# Patient Record
Sex: Female | Born: 1995 | Hispanic: Yes | Marital: Married | Smoking: Never smoker
Health system: Southern US, Community
[De-identification: ages and names within clinical notes are randomized; demographics above are authoritative.]

## PROBLEM LIST (undated history)

## (undated) DIAGNOSIS — J45909 Unspecified asthma, uncomplicated: Secondary | ICD-10-CM

## (undated) DIAGNOSIS — D649 Anemia, unspecified: Secondary | ICD-10-CM

## (undated) HISTORY — DX: Anemia, unspecified: D64.9

## (undated) HISTORY — PX: WISDOM TOOTH EXTRACTION: SHX21

## (undated) HISTORY — DX: Unspecified asthma, uncomplicated: J45.909

---
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
## 2015-02-09 NOTE — H&P (Signed)
L&D Evaluation:  History:  HPI 19yo SHF presented with SROM of clear fluid at 0330a, [id number] EGA, G1P0000; normal PNC to date   Presents with leaking fluid   Patient's Medical History No Chronic Illness   Patient's Surgical History none   Medications Pre Natal Vitamins  Iron   Allergies NKDA   Social History none   Family History Non-Contributory   ROS:  ROS All systems were reviewed.  HEENT, CNS, GI, GU, Respiratory, CV, Renal and Musculoskeletal systems were found to be normal.   Exam:  Vital Signs stable   General no apparent distress   Mental Status clear   Chest clear   Heart normal sinus rhythm   Abdomen gravid, tender with contractions   Estimated Fetal Weight Average for gestational age   Fetal Position vtx   Edema no edema   Pelvic 3/75/-2   Mebranes Ruptured   Description clear   FHT normal rate with no decels, 128   Ucx regular   Ucx Frequency 3 min   Length of each Contraction 60 seconds   Ucx Pain Scale 8   Impression:  Impression early labor, SROM 4h ago   Plan:  Plan monitor contractions and for cervical change, fluids, ambulate   Electronic Signatures: Ines BloomerBurr, Melody N (CNM)  (Signed 18-Nov-14 07:48)  Authored: L&D Evaluation   Last Updated: 18-Nov-14 07:48 by Ulyses AmorBurr, Melody N (CNM)

## 2015-02-15 ENCOUNTER — Ambulatory Visit
Admission: RE | Admit: 2015-02-15 | Discharge: 2015-02-15 | Disposition: A | Discharge status: Home / Self Care | Payer: BLUE CROSS/BLUE SHIELD | Source: Ambulatory Visit | Attending: Family Medicine | Admitting: Family Medicine

## 2015-02-15 ENCOUNTER — Other Ambulatory Visit: Payer: Self-pay | Admitting: Family Medicine

## 2015-02-15 DIAGNOSIS — R7611 Nonspecific reaction to tuberculin skin test without active tuberculosis: Secondary | ICD-10-CM

## 2017-03-09 IMAGING — CR DG CHEST 1V
1 series · 1 of 1 positions shown · non-contrast
Comparison: 01/16/2006.

CLINICAL DATA: Asymptomatic positive PPD.

EXAM:
CHEST  1 VIEW

[dg chest 1 view]
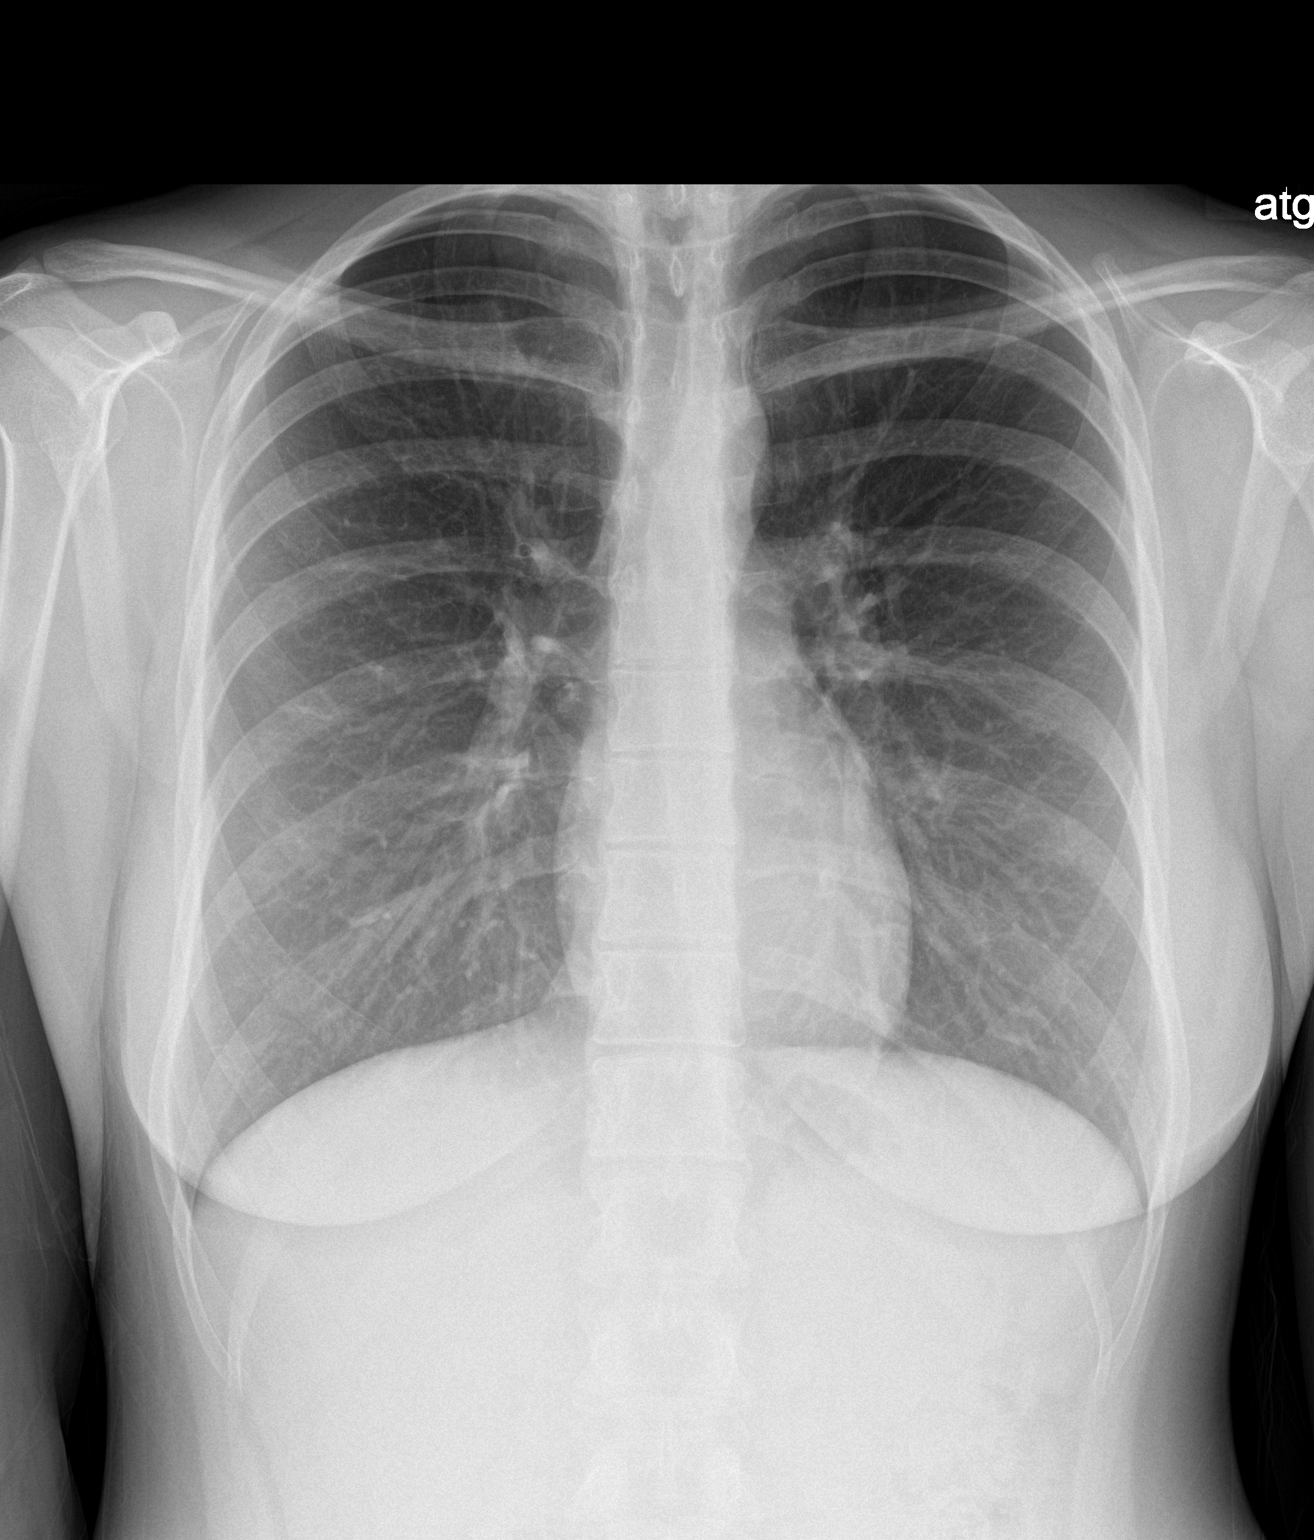

[1 of 1 positions shown; findings below may reference images not displayed]

FINDINGS: Cardiomediastinal silhouette unremarkable. Lungs clear.
Bronchovascular markings normal. Pulmonary vascularity normal. No
visible pleural effusions. No pneumothorax. Visualized bony thorax
intact.
IMPRESSION: Normal examination.

## 2018-01-30 ENCOUNTER — Ambulatory Visit (INDEPENDENT_AMBULATORY_CARE_PROVIDER_SITE_OTHER): Payer: BC Managed Care – PPO | Admitting: Obstetrics and Gynecology

## 2018-01-30 ENCOUNTER — Encounter: Payer: Self-pay | Admitting: Obstetrics and Gynecology

## 2018-01-30 VITALS — BP 91/56 | HR 72 | Ht 63.0 in | Wt 132.4 lb

## 2018-01-30 DIAGNOSIS — N361 Urethral diverticulum: Secondary | ICD-10-CM | POA: Diagnosis not present

## 2018-01-30 DIAGNOSIS — N644 Mastodynia: Secondary | ICD-10-CM | POA: Diagnosis not present

## 2018-01-30 DIAGNOSIS — Z01419 Encounter for gynecological examination (general) (routine) without abnormal findings: Secondary | ICD-10-CM | POA: Diagnosis not present

## 2018-01-30 NOTE — Patient Instructions (Signed)

## 2018-01-30 NOTE — Progress Notes (Signed)
Pt is having breast tenderness x 4 months. Last pap smear about a year ago.

## 2018-01-30 NOTE — Progress Notes (Signed)
GYNECOLOGY ANNUAL PHYSICAL EXAM PROGRESS NOTE  Subjective:    Tammy Contreras is a 22 y.o. G34P1001 female who presents for an annual exam.  The patient is sexually active.  The patient wears seatbelts: yes. The patient participates in regular exercise: no. Has the patient ever been transfused or tattooed?: no. The patient reports that there is not domestic violence in her life.   The patient has the following complaints today:  1. Patient states that she was told that she had a "lesion or bump" on her cervix [facility] Was told that it was yellow in color and small, and that it was not worrisome.  Notes that she wants to have it checked out.  Denies h/o abnormal pap smears.  Thinks she had a pap smear performed last year.  2. Breast tenderness x 4 months.  Notes that the tenderness is non-cyclic, not associated with menses.   Gynecologic History Menarche age: 69 Patient's last menstrual period was 01/20/2018. Contraception: condoms History of STI's: Denies Last Pap: patient thinks it was last year, [facility]. Results were: normal.  Denies h/o abnormal pap smears.    OB History  Gravida Para Term Preterm AB Living  0 0 1  SAB TAB Ectopic Multiple Live Births  0 0 0 0 1    # Outcome Date GA Lbr Len/2nd Weight Sex Delivery Anes PTL Lv  1 Term 2014    F Vag-Spont       Past Medical History:  Diagnosis Date  . Anemia   . Asthma     History reviewed. No pertinent surgical history.  History reviewed. No pertinent family history.  Social History   Socioeconomic History  . Marital status: Single    Spouse name: Not on file  . Number of children: Not on file  . Years of education: Not on file  . Highest education level: Not on file  Occupational History  . Not on file  Social Needs  . Financial resource strain: Not on file  . Food insecurity:    Worry: Not on file    Inability: Not on file    . Transportation needs:    Medical: Not on file    Non-medical: Not on file  Tobacco Use  . Smoking status: Never Smoker  . Smokeless tobacco: Never Used  Substance and Sexual Activity  . Alcohol use: Yes    Comment: occas  . Drug use: Never  . Sexual activity: Yes    Birth control/protection: None  Lifestyle  . Physical activity:    Days per week: Not on file    Minutes per session: Not on file  . Stress: Not on file  Relationships  . Social connections:    Talks on phone: Not on file    Gets together: Not on file    Attends religious service: Not on file    Active member of club or organization: Not on file    Attends meetings of clubs or organizations: Not on file    Relationship status: Not on file  . Intimate partner violence:    Fear of current or ex partner: Not on file    Emotionally abused: Not on file    Physically abused: Not on file    Forced sexual activity: Not on file  Other Topics Concern  . Not on file  Social History Narrative  . Not on file  No current outpatient medications on file prior to visit.   No current facility-administered medications on file prior to visit.     No Known Allergies    Review of Systems Constitutional: negative for chills, fatigue, fevers and sweats Eyes: negative for irritation, redness and visual disturbance Ears, nose, mouth, throat, and face: negative for hearing loss, nasal congestion, snoring and tinnitus Respiratory: negative for asthma, cough, sputum Cardiovascular: negative for chest pain, dyspnea, exertional chest pressure/discomfort, irregular heart beat, palpitations and syncope Gastrointestinal: negative for abdominal pain, change in bowel habits, nausea and vomiting Genitourinary: negative for abnormal menstrual periods, genital lesions, sexual problems and vaginal discharge, dysuria and urinary incontinence Integument/breast: negative for breast lump, breast tenderness and nipple  discharge Hematologic/lymphatic: negative for bleeding and easy bruising Musculoskeletal:negative for back pain and muscle weakness Neurological: negative for dizziness, headaches, vertigo and weakness Endocrine: negative for diabetic symptoms including polydipsia, polyuria and skin dryness Allergic/Immunologic: negative for hay fever and urticaria        Objective:  Blood pressure (!) 91/56, pulse 72, height  (1.6 m), weight 132 lb 6.4 oz (60.1 kg), last menstrual period 01/20/2018. Body mass index is 23.45 kg/m.   General Appearance:    Alert, cooperative, no distress, appears stated age  Head:    Normocephalic, without obvious abnormality, atraumatic  Eyes:    PERRL, conjunctiva/corneas clear, EOM's intact, both eyes  Ears:    Normal external ear canals, both ears  Nose:   Nares normal, septum midline, mucosa normal, no drainage or sinus tenderness  Throat:   Lips, mucosa, and tongue normal; teeth and gums normal  Neck:   Supple, symmetrical, trachea midline, no adenopathy; thyroid: no enlargement/tenderness/nodules; no carotid bruit or JVD  Back:     Symmetric, no curvature, ROM normal, no CVA tenderness  Lungs:     Clear to auscultation bilaterally, respirations unlabored  Chest Wall:    No tenderness or deformity   Heart:    Regular rate and rhythm, S1 and S2 normal, no murmur, rub or gallop  Breast Exam:    No tenderness, masses, or nipple abnormality  Abdomen:     Soft, non-tender, bowel sounds active all four quadrants, no masses, no organomegaly.    Genitalia:    Pelvic:external genitalia with moderate sized urethral diverticulum, soft, non-tender, no discharge expressed on palpation. Vagina without lesions, discharge, or tenderness, rectovaginal septum  normal. Cervix normal in appearance, no cervical motion tenderness, no adnexal masses or tenderness.  Uterus normal size, shape, mobile, regular contours, nontender.  Rectal:    Normal external sphincter.  No hemorrhoids  appreciated. Internal exam not done.   Extremities:   Extremities normal, atraumatic, no cyanosis or edema  Pulses:   2+ and symmetric all extremities  Skin:   Skin color, texture, turgor normal, no rashes or lesions  Lymph nodes:   Cervical, supraclavicular, and axillary nodes normal  Neurologic:   CNII-XII intact, normal strength, sensation and reflexes throughout   .  Labs:  Lab Results  Component Value Date   WBC 10.9 08/19/2013   HGB 12.1 08/19/2013   HCT 27.3 (L) 08/20/2013   MCV 97 08/19/2013   PLT 153 08/19/2013    No results found for: CREATININE, BUN, NA, K, CL, CO2  No results found for: ALT, AST, GGT, ALKPHOS, BILITOT  No results found for: TSH   Assessment:    Healthy female exam.   Urerthral diverticulum  Mastalgia   Plan:    Blood tests: CBC with diff  and Comprehensive metabolic panel. Breast self exam technique reviewed and patient encouraged to perform self-exam monthly. Contraception: condoms. Discussed healthy lifestyle modifications. Pap smear, patient thinks she had at [facility], will get records.  If no pap smear performed, will send specimen collected today, otherwise if pap smear on file, will discard current specimen.  Urethral diverticulum noted on today's exam, non-tender, non-painful. Discussed treatment options, including drainage, or referral to Urology for excision.  Patient notes that she had a history of these during her pregnancy several years ago that were drained, but has not been symptomatic since then. Desires expectant management currently as she is not symptomatic.  Mastalgia - discussed conservative treatment measures including ice packs, supportive bras, and decreasing caffeine intake.   Hildred Laser, MD [facility]

## 2018-01-31 LAB — COMPREHENSIVE METABOLIC PANEL WITH GFR
ALT: 11 IU/L (ref 0–32)
AST: 12 IU/L (ref 0–40)
Albumin/Globulin Ratio: 1.5 (ref 1.2–2.2)
Albumin: 4.6 g/dL (ref 3.5–5.5)
Alkaline Phosphatase: 68 IU/L (ref 39–117)
BUN/Creatinine Ratio: 16 (ref 9–23)
BUN: 10 mg/dL (ref 6–20)
Bilirubin Total: 0.5 mg/dL (ref 0.0–1.2)
CO2: 25 mmol/L (ref 20–29)
Calcium: 9.8 mg/dL (ref 8.7–10.2)
Chloride: 104 mmol/L (ref 96–106)
Creatinine, Ser: 0.61 mg/dL (ref 0.57–1.00)
GFR calc Af Amer: 149 mL/min/1.73
GFR calc non Af Amer: 129 mL/min/1.73
Globulin, Total: 3 g/dL (ref 1.5–4.5)
Glucose: 74 mg/dL (ref 65–99)
Potassium: 4.2 mmol/L (ref 3.5–5.2)
Sodium: 142 mmol/L (ref 134–144)
Total Protein: 7.6 g/dL (ref 6.0–8.5)

## 2018-01-31 LAB — CBC
Hematocrit: 35.9 % (ref 34.0–46.6)
Hemoglobin: 12.1 g/dL (ref 11.1–15.9)
MCH: 30.1 pg (ref 26.6–33.0)
MCHC: 33.7 g/dL (ref 31.5–35.7)
MCV: 89 fL (ref 79–97)
Platelets: 233 x10E3/uL (ref 150–379)
RBC: 4.02 x10E6/uL (ref 3.77–5.28)
RDW: 13.4 % (ref 12.3–15.4)
WBC: 5.7 x10E3/uL (ref 3.4–10.8)

## 2018-06-05 ENCOUNTER — Encounter: Payer: Self-pay | Admitting: Obstetrics and Gynecology

## 2018-06-05 ENCOUNTER — Ambulatory Visit: Payer: BC Managed Care – PPO | Admitting: Obstetrics and Gynecology

## 2018-06-05 ENCOUNTER — Other Ambulatory Visit (HOSPITAL_COMMUNITY)
Admission: RE | Admit: 2018-06-05 | Discharge: 2018-06-05 | Disposition: A | Discharge status: Home / Self Care | Payer: BC Managed Care – PPO | Source: Ambulatory Visit | Attending: Obstetrics and Gynecology | Admitting: Obstetrics and Gynecology

## 2018-06-05 VITALS — BP 96/64 | HR 73 | Ht 63.0 in | Wt 133.5 lb

## 2018-06-05 DIAGNOSIS — N361 Urethral diverticulum: Secondary | ICD-10-CM

## 2018-06-05 DIAGNOSIS — R399 Unspecified symptoms and signs involving the genitourinary system: Secondary | ICD-10-CM | POA: Diagnosis not present

## 2018-06-05 DIAGNOSIS — B373 Candidiasis of vulva and vagina: Secondary | ICD-10-CM | POA: Diagnosis not present

## 2018-06-05 DIAGNOSIS — N898 Other specified noninflammatory disorders of vagina: Secondary | ICD-10-CM

## 2018-06-05 DIAGNOSIS — R1030 Lower abdominal pain, unspecified: Secondary | ICD-10-CM | POA: Diagnosis not present

## 2018-06-05 LAB — POCT URINALYSIS DIPSTICK
Bilirubin, UA: NEGATIVE
GLUCOSE UA: NEGATIVE
Ketones, UA: NEGATIVE
Nitrite, UA: NEGATIVE
Protein, UA: NEGATIVE
Spec Grav, UA: 1.01 (ref 1.010–1.025)
Urobilinogen, UA: 0.2 E.U./dL
pH, UA: 8 (ref 5.0–8.0)

## 2018-06-05 MED ORDER — SULFAMETHOXAZOLE-TRIMETHOPRIM 800-160 MG PO TABS: 1.0000 | ORAL_TABLET | Freq: Two times a day (BID) | ORAL | 1 refills | Status: DC

## 2018-06-05 NOTE — Progress Notes (Signed)
Pt stated that she had bad stomach and vaginal pain starting Monday and some pain yesterday.  Pt stated that her urine smelled weird.

## 2018-06-05 NOTE — Progress Notes (Signed)
    GYNECOLOGY PROGRESS NOTE  Subjective:    Patient ID: Tammy Contreras, female    DOB: 1996/02/25, 22 y.o.   MRN: [id number]  HPI  Patient is a 22 y.o. G30P1001 female with h/o urethral diverticulum who presents for complaints of abdominal pain and vaginal pain and in lower back on Monday.  Pain lasted about 1 hour, was sharp, stabbing. Had difficulty moving.  Denies nausea/vomiting. Drank chamomile tea which helped her to go to sleep. Noted pain again yesterday, but not as severe. Also noting a urinary odor for the past several days. She does report vaginal spotting on Saturday during sexual intercourse. Denies dysuria, hematuria. Is not on any contraception or barrier method.   The following portions of the patient's history were reviewed and updated as appropriate: allergies, current medications, past family history, past medical history, past social history, past surgical history and problem list.  Review of Systems Pertinent items noted in HPI and remainder of comprehensive ROS otherwise negative.   Objective:   Blood pressure 96/64, pulse 73, height 5\' 3"  (1.6 m), weight 133 lb 8 oz (60.6 kg), last menstrual period 05/10/2018. General appearance: alert and no distress Abdomen: normal findings: bowel sounds normal, no bruits heard and soft and abnormal findings:  mild tenderness in the lower abdomen (mostly near midline, suprapubic region)  Pelvic: external genitalia normal, rectovaginal septum normal. Large urethral diverticulum present, mildly tender to palpation, expression of yellow thick fluid on palpation.   Vagina with small amount of thin white discharge, no odor.  Cervix normal appearing, no lesions and no motion tenderness.  Uterus mobile, nontender, normal shape and size.  Adnexae non-palpable, nontender bilaterally.  Back: symmetric, no curvature. ROM normal. No CVA tenderness. Neurologic: grossly intact  Labs: Results for orders placed or performed in visit on  06/05/18  POCT urinalysis dipstick  Result Value Ref Range   Color, UA yellow    Clarity, UA clear    Glucose, UA Negative Negative   Bilirubin, UA neg    Ketones, UA neg    Spec Grav, UA 1.010 1.010 - 1.025   Blood, UA non hem trace    pH, UA 8.0 5.0 - 8.0   Protein, UA Negative Negative   Urobilinogen, UA 0.2 0.2 or 1.0 E.U./dL   Nitrite, UA neg    Leukocytes, UA Moderate (2+) (A) Negative   Appearance yellow    Odor       Assessment:   Abdominal pain UTI symptoms Urethral diverticulum Vaginal discharge  Plan:   - Unclear cause of abdominal (and vaginal pain), however symptoms similar to UTI but no evidence of UTI obvious on today's UA.  Also will need to r/o vaginitis/cervicitis as cause of pain.  Patient did have non-hemolyzed trace RBCs on UA.  Will send urine and urethral culture also due to large diverticulum present with thick yellow discharge.  Vaginal swab performed to r/o vaginitis/cervicitis.  - Initiated on Bactrim to treat possible urethritis, UTI. Encouraged adequate hydration.  - Offered to drain diverticulum, but patient declined today. Advised to do sitz baths and can manually express fluid from diverticulum as well.  - Counseled on safe sex practices. - To use OTC pain relief meds as needed.  - Further workup and treatment could be done if symptoms persist, worsen or new related symptoms occur. The patient will call in that eventuality.    Hildred Laser, MD [facility]

## 2018-06-06 LAB — CERVICOVAGINAL ANCILLARY ONLY
BACTERIAL VAGINITIS: NEGATIVE
CANDIDA VAGINITIS: POSITIVE — AB
Chlamydia: NEGATIVE
Neisseria Gonorrhea: NEGATIVE
Trichomonas: NEGATIVE

## 2018-06-07 ENCOUNTER — Other Ambulatory Visit: Payer: Self-pay

## 2018-06-07 ENCOUNTER — Telehealth: Payer: Self-pay | Admitting: Obstetrics and Gynecology

## 2018-06-07 MED ORDER — FLUCONAZOLE 150 MG PO TABS: 150.0000 mg | ORAL_TABLET | Freq: Once | ORAL | 1 refills | Status: AC

## 2018-06-07 NOTE — Telephone Encounter (Signed)
The patient called and stated that she missed a call, I was unable to see any documentation in the patients chart of a missed call. Please advise.

## 2018-06-07 NOTE — Telephone Encounter (Signed)
Pt was called and went over test results with pt. Pt is aware of test results.

## 2018-06-09 LAB — ANAEROBIC AND AEROBIC CULTURE

## 2018-06-19 ENCOUNTER — Telehealth: Payer: Self-pay | Admitting: Obstetrics and Gynecology

## 2018-06-19 ENCOUNTER — Other Ambulatory Visit: Payer: Self-pay

## 2018-06-19 MED ORDER — FLUCONAZOLE 150 MG PO TABS: 150.0000 mg | ORAL_TABLET | Freq: Once | ORAL | 0 refills | Status: AC

## 2018-06-19 MED ORDER — NITROFURANTOIN MONOHYD MACRO 100 MG PO CAPS: 100.0000 mg | ORAL_CAPSULE | Freq: Two times a day (BID) | ORAL | 0 refills | Status: DC

## 2018-06-19 NOTE — Telephone Encounter (Signed)
The patient is calling the nurse back and has questions.  She is requesting a call from the nurse again, please advise, thanks.

## 2018-06-20 NOTE — Telephone Encounter (Signed)
Spoke with pt yesterday. Pt stated that she had a positive pregnancy test and wanted to know if the test was positive due to her having yeast infection and a UTI. Pt was informed that having those dx would not cause her pregnancy test to be positive. Pt stated that [facility] to [facility]. Pt made an appt and will be seen in the office.

## 2018-06-24 ENCOUNTER — Telehealth: Payer: Self-pay | Admitting: Obstetrics and Gynecology

## 2018-06-24 NOTE — Telephone Encounter (Signed)
The patient is having pain in the belt-line area of her abdomen right below her belly button, and it radiates to her back in the same spot, "towards my butt. I can kinda feel it back there too."  The contact number has voicemail set up.  The pain comes and goes anytime.  Pain is not relieved when she lies down.  The patient is asking for the nurse or provider to contact her on what she needs to do.  Please advise, thanks.

## 2018-06-25 ENCOUNTER — Other Ambulatory Visit: Payer: Self-pay | Admitting: Obstetrics and Gynecology

## 2018-06-25 ENCOUNTER — Encounter: Payer: Self-pay | Admitting: Obstetrics and Gynecology

## 2018-06-25 ENCOUNTER — Other Ambulatory Visit (INDEPENDENT_AMBULATORY_CARE_PROVIDER_SITE_OTHER): Payer: BC Managed Care – PPO

## 2018-06-25 ENCOUNTER — Ambulatory Visit (INDEPENDENT_AMBULATORY_CARE_PROVIDER_SITE_OTHER): Payer: BC Managed Care – PPO | Admitting: Obstetrics and Gynecology

## 2018-06-25 VITALS — BP 92/58 | HR 74 | Ht 63.0 in | Wt 132.9 lb

## 2018-06-25 DIAGNOSIS — Z3201 Encounter for pregnancy test, result positive: Secondary | ICD-10-CM

## 2018-06-25 DIAGNOSIS — N361 Urethral diverticulum: Secondary | ICD-10-CM

## 2018-06-25 DIAGNOSIS — Z3A01 Less than 8 weeks gestation of pregnancy: Secondary | ICD-10-CM | POA: Diagnosis not present

## 2018-06-25 DIAGNOSIS — N926 Irregular menstruation, unspecified: Secondary | ICD-10-CM

## 2018-06-25 DIAGNOSIS — O200 Threatened abortion: Secondary | ICD-10-CM

## 2018-06-25 LAB — POCT URINE PREGNANCY: Preg Test, Ur: POSITIVE — AB

## 2018-06-25 NOTE — Telephone Encounter (Signed)
Pt called this morning and [facility]

## 2018-06-25 NOTE — Progress Notes (Signed)
GYNECOLOGY CLINIC PROGRESS NOTE Subjective:    Tammy Contreras is a 22 y.o. 191P1001 female. [place] reports bleeding and pelvic pain since early today.  Bleeding is brown, light flow (mostly spotting).  Plevic pain feels like cramping, radiates to the back, currently 7-8/10. Patient reports positive home pregnancy test ~ 2 weeks ago. She is not in acute distress. Ectopic risks: none.   Cycle length: regular, every 28-30 days. Pregnancy testing: at home.   The following portions of the patient's history were reviewed and updated as appropriate: allergies, current medications, past family history, past medical history, past social history, past surgical history and problem list.  Review of Systems Pertinent items noted in HPI and remainder of comprehensive ROS otherwise negative.   Objective:     BP (!) 92/58   Pulse 74   Ht 5\' 3"  (1.6 m)   Wt 132 lb 14.4 oz (60.3 kg)   LMP 05/10/2018   BMI 23.54 kg/m  General:   alert and no distress  Heart: regular rate and rhythm, S1, S2 normal, no murmur, click, rub or gallop  Lungs: clear to auscultation bilaterally  Abdomen: soft, active bowel sounds, tenderness mild in the lower abdomen, without guarding and without rebound  Pelvic: External genitalia: normal general appearance Urinary system: urethral diverticulum present, ~ 2 x 3 cm, with yellow discharge draining when palpated Vaginal: normal mucosa without prolapse or lesions. Scant light brown discharge in vaginal vault.  Cervix: normal appearance, closed.  Adnexa: normal bimanual exam Uterus: normal mobile, nontender   Imaging Office ultrasound:    [facility] REPORT  Location: [facility] Date of Service:  06/25/2018  Indications: Dating/Viability; Spotting and pain Findings:  A single intrauterine gestational sac is visualized measuring approximately 5 4/[redacted] weeks gestation, giving an (U/S) EDD of 02/21/19. A clinical EDD has not been established, however, today's  scan does not agree with patient stated LMP of 05/10/18.  A normal appearing yolk sac is identified within the gestational sac. A fetal pole is not identified at this time (early pregnancy vs other etiology).  Right Ovary measures 3.5 x 3.0 x 2.3 cm. It is normal in appearance. Left Ovary measures 3.1 x 2.2 x 2.0 cm. It is normal appearance. There is evidence of a corpus luteal cyst in the right ovary. Survey of the adnexa demonstrates no adnexal masses. There is no free peritoneal fluid in the cul de sac.  Impression: 1. Single intrauterine gestational sac measuring approximately 5 4/7 weeks. 2. A fetal pole is not identified at this time. 3. A clinical EDD has not been established, however, today's scan does not agree with patient stated LMP of 05/10/18. 4. Notified Dr. Valentino Saxonherry of findings.  Recommendations: 1.Clinical correlation with the patient's History and Physical Exam. 2. Repeat U/S in 2 weeks to reassess dating/viability.  Kari BaarsJill Long, RDMS   I have reviewed this study and agree with documented findings.    Hildred LaserAnika Shonta Bourque, MD [facility]   Assessment:    IUP at [redacted] weeks gestation Threatened abortion  Urethral diverticulum  Plan:   UPT positive today in clinic. Ultrasound notes IUP, gestational sac but no fetal pole noted yet, at [redacted] weeks gestation.   Blood type and Rh: ordered. Follow-up appointment with ultrasound on 2 weeks to f/u viability.   Given bleeding precautions.  Advised on Tylenol for pain and cramping prn.  Encouraged to begin PNV.  Samples given.  Urethral diverticulum recently treated with a course of antibiotics, more fluid expressed  today, continues to decline needle aspiration.   Hildred Laser, MD [facility]

## 2018-06-25 NOTE — Progress Notes (Signed)
Pt is present today due to noticing some brown blood never seen any bright red blood. Pt stated that she noticed this blood 2 times. Pt stated that she has pain in the lower abd/pelvic area that goes into the lower back. Pt stated pain level 7-8.

## 2018-06-26 MED ORDER — CITRANATAL ASSURE 35-1 & 300 MG PO MISC: 1.0000 | Freq: Every day | ORAL | 11 refills | Status: DC

## 2018-06-26 NOTE — Addendum Note (Signed)
Addended by: Silvano BilisHAMPTON, Shantae Vantol L on: 06/26/2018 10:45 AM   Modules accepted: Orders

## 2018-06-28 ENCOUNTER — Encounter: Payer: BC Managed Care – PPO | Admitting: Obstetrics and Gynecology

## 2018-07-16 ENCOUNTER — Ambulatory Visit (INDEPENDENT_AMBULATORY_CARE_PROVIDER_SITE_OTHER): Payer: BC Managed Care – PPO

## 2018-07-16 ENCOUNTER — Ambulatory Visit: Payer: BC Managed Care – PPO | Admitting: Obstetrics and Gynecology

## 2018-07-16 VITALS — BP 110/65 | HR 85 | Ht 63.0 in | Wt 133.8 lb

## 2018-07-16 DIAGNOSIS — N8311 Corpus luteum cyst of right ovary: Secondary | ICD-10-CM

## 2018-07-16 DIAGNOSIS — O3411 Maternal care for benign tumor of corpus uteri, first trimester: Secondary | ICD-10-CM | POA: Diagnosis not present

## 2018-07-16 DIAGNOSIS — O200 Threatened abortion: Secondary | ICD-10-CM

## 2018-07-16 DIAGNOSIS — Z3A08 8 weeks gestation of pregnancy: Secondary | ICD-10-CM | POA: Diagnosis not present

## 2018-07-16 DIAGNOSIS — N926 Irregular menstruation, unspecified: Secondary | ICD-10-CM

## 2018-07-16 LAB — OB RESULTS CONSOLE VARICELLA ZOSTER ANTIBODY, IGG: Varicella: IMMUNE

## 2018-07-16 NOTE — Patient Instructions (Signed)
First Trimester of Pregnancy The first trimester of pregnancy is from week 1 until the end of week 13 (months 1 through 3). During this time, your baby will begin to develop inside you. At 6-8 weeks, the eyes and face are formed, and the heartbeat can be seen on ultrasound. At the end of 12 weeks, all the baby's organs are formed. Prenatal care is all the medical care you receive before the birth of your baby. Make sure you get good prenatal care and follow all of your doctor's instructions. Follow these instructions at home: Medicines  Take over-the-counter and prescription medicines only as told by your doctor. Some medicines are safe and some medicines are not safe during pregnancy.  Take a prenatal vitamin that contains at least 600 micrograms (mcg) of folic acid.  If you have trouble pooping (constipation), take medicine that will make your stool soft (stool softener) if your doctor approves. Eating and drinking  Eat regular, healthy meals.  Your doctor will tell you the amount of weight gain that is right for you.  Avoid raw meat and uncooked cheese.  If you feel sick to your stomach (nauseous) or throw up (vomit): ? Eat 4 or 5 small meals a day instead of 3 large meals. ? Try eating a few soda crackers. ? Drink liquids between meals instead of during meals.  To prevent constipation: ? Eat foods that are high in fiber, like fresh fruits and vegetables, whole grains, and beans. ? Drink enough fluids to keep your pee (urine) clear or pale yellow. Activity  Exercise only as told by your doctor. Stop exercising if you have cramps or pain in your lower belly (abdomen) or low back.  Do not exercise if it is too hot, too humid, or if you are in a place of great height (high altitude).  Try to avoid standing for long periods of time. Move your legs often if you must stand in one place for a long time.  Avoid heavy lifting.  Wear low-heeled shoes. Sit and stand up straight.  You  can have sex unless your doctor tells you not to. Relieving pain and discomfort  Wear a good support bra if your breasts are sore.  Take warm water baths (sitz baths) to soothe pain or discomfort caused by hemorrhoids. Use hemorrhoid cream if your doctor says it is okay.  Rest with your legs raised if you have leg cramps or low back pain.  If you have puffy, bulging veins (varicose veins) in your legs: ? Wear support hose or compression stockings as told by your doctor. ? Raise (elevate) your feet for 15 minutes, 3-4 times a day. ? Limit salt in your food. Prenatal care  Schedule your prenatal visits by the twelfth week of pregnancy.  Write down your questions. Take them to your prenatal visits.  Keep all your prenatal visits as told by your doctor. This is important. Safety  Wear your seat belt at all times when driving.  Make a list of emergency phone numbers. The list should include numbers for family, friends, the hospital, and police and fire departments. General instructions  Ask your doctor for a referral to a local prenatal class. Begin classes no later than at the start of month 6 of your pregnancy.  Ask for help if you need counseling or if you need help with nutrition. Your doctor can give you advice or tell you where to go for help.  Do not use hot tubs, steam rooms, or   saunas.  Do not douche or use tampons or scented sanitary pads.  Do not cross your legs for long periods of time.  Avoid all herbs and alcohol. Avoid drugs that are not approved by your doctor.  Do not use any tobacco products, including cigarettes, chewing tobacco, and electronic cigarettes. If you need help quitting, ask your doctor. You may get counseling or other support to help you quit.  Avoid cat litter boxes and soil used by cats. These carry germs that can cause birth defects in the baby and can cause a loss of your baby (miscarriage) or stillbirth.  Visit your dentist. At home, brush  your teeth with a soft toothbrush. Be gentle when you floss. Contact a doctor if:  You are dizzy.  You have mild cramps or pressure in your lower belly.  You have a nagging pain in your belly area.  You continue to feel sick to your stomach, you throw up, or you have watery poop (diarrhea).  You have a bad smelling fluid coming from your vagina.  You have pain when you pee (urinate).  You have increased puffiness (swelling) in your face, hands, legs, or ankles. Get help right away if:  You have a fever.  You are leaking fluid from your vagina.  You have spotting or bleeding from your vagina.  You have very bad belly cramping or pain.  You gain or lose weight rapidly.  You throw up blood. It may look like coffee grounds.  You are around people who have German measles, fifth disease, or chickenpox.  You have a very bad headache.  You have shortness of breath.  You have any kind of trauma, such as from a fall or a car accident. Summary  The first trimester of pregnancy is from week 1 until the end of week 13 (months 1 through 3).  To take care of yourself and your unborn baby, you will need to eat healthy meals, take medicines only if your doctor tells you to do so, and do activities that are safe for you and your baby.  Keep all follow-up visits as told by your doctor. This is important as your doctor will have to ensure that your baby is healthy and growing well. This information is not intended to replace advice given to you by your health care provider. Make sure you discuss any questions you have with your health care provider. Document Released: 03/06/2008 Document Revised: 09/26/2016 Document Reviewed: 09/26/2016 Elsevier Interactive Patient Education  2017 [employer]  

## 2018-07-16 NOTE — Progress Notes (Signed)
Tammy Contreras presents for NOB nurse interview visit. Pregnancy confirmation done here at [facility]..  G-2 .  P-  1  . Pregnancy education material explained and given. _No cats in the home. NOB labs ordered, . HIV labs and Drug screen were explained optional and she did not decline. Drug screen ordered PNV encouraged. Genetic screening options discussed. Genetic testing:/Unsure.  Pt may discuss with provider. Pt. To follow up with provider in _3_ weeks for NOB physical.  All questions answered.FMLA paperwork signed, financial policy discussed with pt.  She voiced understanding

## 2018-07-17 LAB — HIV ANTIBODY (ROUTINE TESTING W REFLEX): HIV Screen 4th Generation wRfx: NONREACTIVE

## 2018-07-17 LAB — URINALYSIS, ROUTINE W REFLEX MICROSCOPIC
BILIRUBIN UA: NEGATIVE
Glucose, UA: NEGATIVE
Ketones, UA: NEGATIVE
Nitrite, UA: NEGATIVE
PH UA: 7.5 (ref 5.0–7.5)
Protein, UA: NEGATIVE
Specific Gravity, UA: 1.02 (ref 1.005–1.030)
UUROB: 0.2 mg/dL (ref 0.2–1.0)

## 2018-07-17 LAB — HGB SOLU + RFLX FRAC: Sickle Solubility Test - HGBRFX: NEGATIVE

## 2018-07-17 LAB — MICROSCOPIC EXAMINATION
CASTS: NONE SEEN /LPF
Epithelial Cells (non renal): >10 /hpf — AB (ref 0–10)

## 2018-07-17 LAB — RPR: RPR Ser Ql: NONREACTIVE

## 2018-07-17 LAB — RUBELLA SCREEN: Rubella Antibodies, IGG: 1.61 index (ref >0.99)

## 2018-07-17 LAB — ANTIBODY SCREEN: ANTIBODY SCREEN: NEGATIVE

## 2018-07-17 LAB — ABO AND RH: RH TYPE: POSITIVE

## 2018-07-17 LAB — VARICELLA ZOSTER ANTIBODY, IGG: Varicella zoster IgG: 914 index (ref >165)

## 2018-07-17 LAB — HEPATITIS B SURFACE ANTIGEN: Hepatitis B Surface Ag: NEGATIVE

## 2018-07-18 LAB — MONITOR DRUG PROFILE 14(MW)
Amphetamine Scrn, Ur: NEGATIVE ng/mL
BARBITURATE SCREEN URINE: NEGATIVE ng/mL
BENZODIAZEPINE SCREEN, URINE: NEGATIVE ng/mL
BUPRENORPHINE, URINE: NEGATIVE ng/mL
CANNABINOIDS UR QL SCN: NEGATIVE ng/mL
CREATININE(CRT), U: 135.6 mg/dL (ref 20.0–300.0)
Cocaine (Metab) Scrn, Ur: NEGATIVE ng/mL
FENTANYL, URINE: NEGATIVE pg/mL
MEPERIDINE SCREEN, URINE: NEGATIVE ng/mL
Methadone Screen, Urine: NEGATIVE ng/mL
OXYCODONE+OXYMORPHONE UR QL SCN: NEGATIVE ng/mL
Opiate Scrn, Ur: NEGATIVE ng/mL
PHENCYCLIDINE QUANTITATIVE URINE: NEGATIVE ng/mL
Ph of Urine: 7.7 (ref 4.5–8.9)
Propoxyphene Scrn, Ur: NEGATIVE ng/mL
SPECIFIC GRAVITY: 1.014
TRAMADOL SCREEN, URINE: NEGATIVE ng/mL

## 2018-07-18 LAB — URINE CULTURE

## 2018-07-18 LAB — GC/CHLAMYDIA PROBE AMP
CHLAMYDIA, DNA PROBE: NEGATIVE
Neisseria gonorrhoeae by PCR: NEGATIVE

## 2018-08-07 ENCOUNTER — Telehealth: Payer: Self-pay | Admitting: Obstetrics and Gynecology

## 2018-08-07 NOTE — Telephone Encounter (Signed)
The patient called and stated that she would like to speak with a nurse in regards to getting clarification on her medications that were sent to her pharmacy. No other information was disclosed. Please advise.

## 2018-08-08 NOTE — Telephone Encounter (Signed)
We discussed her prenatal vitamins

## 2018-08-16 ENCOUNTER — Encounter: Payer: Self-pay | Admitting: Obstetrics and Gynecology

## 2018-08-16 ENCOUNTER — Other Ambulatory Visit (HOSPITAL_COMMUNITY)
Admission: RE | Admit: 2018-08-16 | Discharge: 2018-08-16 | Disposition: A | Discharge status: Home / Self Care | Payer: BC Managed Care – PPO | Source: Ambulatory Visit | Attending: Obstetrics and Gynecology | Admitting: Obstetrics and Gynecology

## 2018-08-16 ENCOUNTER — Ambulatory Visit (INDEPENDENT_AMBULATORY_CARE_PROVIDER_SITE_OTHER): Payer: BC Managed Care – PPO | Admitting: Obstetrics and Gynecology

## 2018-08-16 VITALS — BP 109/67 | HR 76 | Wt 132.0 lb

## 2018-08-16 DIAGNOSIS — Z3492 Encounter for supervision of normal pregnancy, unspecified, second trimester: Secondary | ICD-10-CM | POA: Diagnosis not present

## 2018-08-16 DIAGNOSIS — O9989 Other specified diseases and conditions complicating pregnancy, childbirth and the puerperium: Secondary | ICD-10-CM

## 2018-08-16 DIAGNOSIS — Z3A13 13 weeks gestation of pregnancy: Secondary | ICD-10-CM

## 2018-08-16 DIAGNOSIS — N361 Urethral diverticulum: Secondary | ICD-10-CM

## 2018-08-16 LAB — POCT URINALYSIS DIPSTICK OB
Bilirubin, UA: NEGATIVE
Glucose, UA: NEGATIVE
Ketones, UA: 40
NITRITE UA: NEGATIVE
PH UA: 7 (ref 5.0–8.0)
SPEC GRAV UA: 1.02 (ref 1.010–1.025)
UROBILINOGEN UA: 0.2 U/dL

## 2018-08-16 NOTE — Patient Instructions (Signed)
Second Trimester of Pregnancy The second trimester is from week 13 through week 28, month 4 through 6. This is often the time in pregnancy that you feel your best. Often times, morning sickness has lessened or quit. You may have more energy, and you may get hungry more often. Your unborn baby (fetus) is growing rapidly. At the end of the sixth month, he or she is about 9 inches long and weighs about 1 pounds. You will likely feel the baby move (quickening) between 18 and 20 weeks of pregnancy. Follow these instructions at home:  Avoid all smoking, herbs, and alcohol. Avoid drugs not approved by your doctor.  Do not use any tobacco products, including cigarettes, chewing tobacco, and electronic cigarettes. If you need help quitting, ask your doctor. You may get counseling or other support to help you quit.  Only take medicine as told by your doctor. Some medicines are safe and some are not during pregnancy.  Exercise only as told by your doctor. Stop exercising if you start having cramps.  Eat regular, healthy meals.  Wear a good support bra if your breasts are tender.  Do not use hot tubs, steam rooms, or saunas.  Wear your seat belt when driving.  Avoid raw meat, uncooked cheese, and liter boxes and soil used by cats.  Take your prenatal vitamins.  Take 1500-2000 milligrams of calcium daily starting at the 20th week of pregnancy until you deliver your baby.  Try taking medicine that helps you poop (stool softener) as needed, and if your doctor approves. Eat more fiber by eating fresh fruit, vegetables, and whole grains. Drink enough fluids to keep your pee (urine) clear or pale yellow.  Take warm water baths (sitz baths) to soothe pain or discomfort caused by hemorrhoids. Use hemorrhoid cream if your doctor approves.  If you have puffy, bulging veins (varicose veins), wear support hose. Raise (elevate) your feet for 15 minutes, 3-4 times a day. Limit salt in your diet.  Avoid heavy  lifting, wear low heals, and sit up straight.  Rest with your legs raised if you have leg cramps or low back pain.  Visit your dentist if you have not gone during your pregnancy. Use a soft toothbrush to brush your teeth. Be gentle when you floss.  You can have sex (intercourse) unless your doctor tells you not to.  Go to your doctor visits. Get help if:  You feel dizzy.  You have mild cramps or pressure in your lower belly (abdomen).  You have a nagging pain in your belly area.  You continue to feel sick to your stomach (nauseous), throw up (vomit), or have watery poop (diarrhea).  You have bad smelling fluid coming from your vagina.  You have pain with peeing (urination). Get help right away if:  You have a fever.  You are leaking fluid from your vagina.  You have spotting or bleeding from your vagina.  You have severe belly cramping or pain.  You lose or gain weight rapidly.  You have trouble catching your breath and have chest pain.  You notice sudden or extreme puffiness (swelling) of your face, hands, ankles, feet, or legs.  You have not felt the baby move in over an hour.  You have severe headaches that do not go away with medicine.  You have vision changes. This information is not intended to replace advice given to you by your health care provider. Make sure you discuss any questions you have with your health care   provider. Document Released: 12/13/2009 Document Revised: 02/24/2016 Document Reviewed: 11/19/2012 Elsevier Interactive Patient Education  2017 Elsevier Inc.  

## 2018-08-16 NOTE — Progress Notes (Signed)
NEW OB HISTORY AND PHYSICAL  SUBJECTIVE:       Tammy Contreras is a 22 y.o. G16P1001 female, Patient's last menstrual period was 05/16/2018., Estimated Date of Delivery: 02/20/19, [redacted]w[redacted]d, presents today for establishment of Prenatal Care. She has no unusual complaints except pressure and pain at urethral cyst      Gynecologic History Patient's last menstrual period was 05/16/2018. Unknown Contraception: none Last Pap: 2017. Results were: normal  Obstetric History OB History  Gravida Para Term Preterm AB Living  2 1 1     1   SAB TAB Ectopic Multiple Live Births        1 1    # Outcome Date GA Lbr Len/2nd Weight Sex Delivery Anes PTL Lv  2A Gravida           2B Current           1 Term 2014    F Vag-Spont       Past Medical History:  Diagnosis Date  . Anemia   . Asthma     History reviewed. No pertinent surgical history.  Current Outpatient Medications on File Prior to Visit  Medication Sig Dispense Refill  . Prenat w/o A-FeCbGl-DSS-FA-DHA (CITRANATAL ASSURE) 35-1 & 300 MG tablet Take 1 tablet by mouth daily. 30 tablet 11  . nitrofurantoin, macrocrystal-monohydrate, (MACROBID) 100 MG capsule Take 1 capsule (100 mg total) by mouth 2 (two) times daily. (Patient not taking: Reported on 07/16/2018) 14 capsule 0  . sulfamethoxazole-trimethoprim (BACTRIM DS,SEPTRA DS) 800-160 MG tablet Take 1 tablet by mouth 2 (two) times daily. (Patient not taking: Reported on 07/16/2018) 14 tablet 1   No current facility-administered medications on file prior to visit.     No Known Allergies  Social History   Socioeconomic History  . Marital status: Single    Spouse name: Not on file  . Number of children: Not on file  . Years of education: Not on file  . Highest education level: Not on file  Occupational History  . Not on file  Social Needs  . Financial resource strain: Not on file  . Food insecurity:    Worry: Not on file    Inability: Not on file  . Transportation needs:     Medical: Not on file    Non-medical: Not on file  Tobacco Use  . Smoking status: Never Smoker  . Smokeless tobacco: Never Used  Substance and Sexual Activity  . Alcohol use: Yes    Comment: occas  . Drug use: Never  . Sexual activity: Yes    Birth control/protection: None  Lifestyle  . Physical activity:    Days per week: Not on file    Minutes per session: Not on file  . Stress: Not on file  Relationships  . Social connections:    Talks on phone: Not on file    Gets together: Not on file    Attends religious service: Not on file    Active member of club or organization: Not on file    Attends meetings of clubs or organizations: Not on file    Relationship status: Not on file  . Intimate partner violence:    Fear of current or ex partner: Not on file    Emotionally abused: Not on file    Physically abused: Not on file    Forced sexual activity: Not on file  Other Topics Concern  . Not on file  Social History Narrative  . Not on file  Family History  Problem Relation Age of Onset  . Healthy Mother   . Healthy Father     The following portions of the patient's history were reviewed and updated as appropriate: allergies, current medications, past OB history, past medical history, past surgical history, past family history, past social history, and problem list.    OBJECTIVE: Initial Physical Exam (New OB)  GENERAL APPEARANCE: alert, well appearing, in no apparent distress, oriented to person, place and time HEAD: normocephalic, atraumatic MOUTH: mucous membranes moist, pharynx normal without lesions and dental hygiene good THYROID: no thyromegaly or masses present BREASTS: not examined LUNGS: clear to auscultation, no wheezes, rales or rhonchi, symmetric air entry HEART: regular rate and rhythm, no murmurs ABDOMEN: soft, nontender, nondistended, no abnormal masses, no epigastric pain, fundus not palpable and FHT present EXTREMITIES: no redness or tenderness  in the calves or thighs SKIN: normal coloration and turgor, no rashes LYMPH NODES: no adenopathy palpable NEUROLOGIC: alert, oriented, normal speech, no focal findings or movement disorder noted  PELVIC EXAM EXTERNAL GENITALIA: normal appearing vulva with no masses, tenderness or lesions, 3cm urethral cyst- desires I&D as it is uncomfortable   ASSESSMENT: Normal pregnancy Urethral cyst  PLAN: Prenatal care Genetic screening done See orders

## 2018-08-16 NOTE — Progress Notes (Signed)
NOB PE- pt is doing well, is having a much better week, not near as nauseous

## 2018-08-17 LAB — CBC
Hematocrit: 32.8 % — ABNORMAL LOW (ref 34.0–46.6)
Hemoglobin: 11.2 g/dL (ref 11.1–15.9)
MCH: 30.9 pg (ref 26.6–33.0)
MCHC: 34.1 g/dL (ref 31.5–35.7)
MCV: 90 fL (ref 79–97)
PLATELETS: 198 10*3/uL (ref 150–450)
RBC: 3.63 x10E6/uL — ABNORMAL LOW (ref 3.77–5.28)
RDW: 12.8 % (ref 12.3–15.4)
WBC: 6.4 10*3/uL (ref 3.4–10.8)

## 2018-08-17 LAB — FERRITIN: FERRITIN: 27 ng/mL (ref 15–150)

## 2018-08-18 LAB — URINE CULTURE

## 2018-08-19 ENCOUNTER — Telehealth: Payer: Self-pay | Admitting: Obstetrics and Gynecology

## 2018-08-19 LAB — AEROBIC CULTURE

## 2018-08-19 NOTE — Telephone Encounter (Signed)
The patient called and stated that she would like to speak with a nurse in regards to her needing a flu injection due to her employer. Please advise.

## 2018-08-20 ENCOUNTER — Other Ambulatory Visit: Payer: Self-pay | Admitting: Obstetrics and Gynecology

## 2018-08-20 LAB — CYTOLOGY - PAP: DIAGNOSIS: NEGATIVE

## 2018-08-20 MED ORDER — CEFIXIME 400 MG PO CAPS: 400.0000 mg | ORAL_CAPSULE | Freq: Every day | ORAL | 1 refills | Status: DC

## 2018-08-20 NOTE — Telephone Encounter (Signed)
Called pt she is going to stop by and get a flu vaccine

## 2018-08-21 ENCOUNTER — Telehealth: Payer: Self-pay | Admitting: *Deleted

## 2018-08-21 NOTE — Telephone Encounter (Signed)
-----   Message from Purcell NailsMelody N [place], [place] sent at 08/20/2018 11:42 AM EST ----- Please let her know I have sent her in an antibiotic and her culture did show infection.

## 2018-08-21 NOTE — Telephone Encounter (Signed)
Left pt detailed message.

## 2018-08-27 ENCOUNTER — Telehealth: Payer: Self-pay | Admitting: Obstetrics and Gynecology

## 2018-08-27 NOTE — Telephone Encounter (Signed)
The patient did her Maternit 08/16/18 and she is asking if there is any way she can get her results tomorrow when she comes in for her flu shot?  Please advise, thanks.

## 2018-08-28 ENCOUNTER — Ambulatory Visit: Payer: BC Managed Care – PPO | Admitting: Obstetrics and Gynecology

## 2018-08-28 NOTE — Telephone Encounter (Signed)
Results are in

## 2018-09-16 ENCOUNTER — Encounter: Payer: BC Managed Care – PPO | Admitting: Certified Nurse Midwife

## 2018-09-19 ENCOUNTER — Ambulatory Visit (INDEPENDENT_AMBULATORY_CARE_PROVIDER_SITE_OTHER): Payer: BC Managed Care – PPO | Admitting: Certified Nurse Midwife

## 2018-09-19 ENCOUNTER — Encounter: Payer: BC Managed Care – PPO | Admitting: Certified Nurse Midwife

## 2018-09-19 VITALS — BP 100/51 | HR 79 | Wt 131.4 lb

## 2018-09-19 DIAGNOSIS — Z23 Encounter for immunization: Secondary | ICD-10-CM | POA: Diagnosis not present

## 2018-09-19 DIAGNOSIS — Z3492 Encounter for supervision of normal pregnancy, unspecified, second trimester: Secondary | ICD-10-CM

## 2018-09-19 DIAGNOSIS — Z3480 Encounter for supervision of other normal pregnancy, unspecified trimester: Secondary | ICD-10-CM

## 2018-09-19 LAB — POCT URINALYSIS DIPSTICK OB
Bilirubin, UA: NEGATIVE
Blood, UA: NEGATIVE
Glucose, UA: NEGATIVE
KETONES UA: NEGATIVE
LEUKOCYTES UA: NEGATIVE
NITRITE UA: NEGATIVE
PH UA: 5 (ref 5.0–8.0)
PROTEIN: NEGATIVE
Spec Grav, UA: 1.015 (ref 1.010–1.025)
UROBILINOGEN UA: 0.2 U/dL

## 2018-09-19 NOTE — Progress Notes (Addendum)
ROB-Doing well. PNV samples given. Flu vaccine today. Panorama results low risk female. Discussed round ligament pain. Encouraged exercised in pregnancy. Anticipatory guidance regarding course of prenatal care. Reviewed red flag symptoms and when to call. RTC x 3 weeks for ANATOMY SCAN and ROB or sooner if needed.

## 2018-09-19 NOTE — Patient Instructions (Signed)
WHAT OB PATIENTS CAN EXPECT   Confirmation of pregnancy and ultrasound ordered if medically indicated-[redacted] weeks gestation  New OB (NOB) intake with nurse and New OB (NOB) labs- [redacted] weeks gestation  New OB (NOB) physical examination with provider- 11/[redacted] weeks gestation  Flu vaccine-[redacted] weeks gestation  Anatomy scan-[redacted] weeks gestation  Glucose tolerance test, blood work to test for anemia, T-dap vaccine-[redacted] weeks gestation  Vaginal swabs/cultures-STD/Group B strep-[redacted] weeks gestation  Appointments every 4 weeks until 28 weeks  Every 2 weeks from 28 weeks until 36 weeks  Weekly visits from 36 weeks until delivery  Back Pain in Pregnancy Back pain during pregnancy is common. Back pain may be caused by several factors that are related to changes during your pregnancy. Follow these instructions at home: Managing pain, stiffness, and swelling      If directed, for sudden (acute) back pain, put ice on the painful area. ? Put ice in a plastic bag. ? Place a towel between your skin and the bag. ? Leave the ice on for 20 minutes, 2-3 times per day.  If directed, apply heat to the affected area before you exercise. Use the heat source that your health care provider recommends, such as a moist heat pack or a heating pad. ? Place a towel between your skin and the heat source. ? Leave the heat on for 20-30 minutes. ? Remove the heat if your skin turns bright red. This is especially important if you are unable to feel pain, heat, or cold. You may have a greater risk of getting burned.  If directed, massage the affected area. Activity  Exercise as told by your health care provider. Gentle exercise is the best way to prevent or manage back pain.  Listen to your body when lifting. If lifting hurts, ask for help or bend your knees. This uses your leg muscles instead of your back muscles.  Squat down when picking up something from the floor. Do not bend over.  Only use bed rest for short  periods as told by your health care provider. Bed rest should only be used for the most severe episodes of back pain. Standing, sitting, and lying down  Do not stand in one place for long periods of time.  Use good posture when sitting. Make sure your head rests over your shoulders and is not hanging forward. Use a pillow on your lower back if necessary.  Try sleeping on your side, preferably the left side, with a pregnancy support pillow or 1-2 regular pillows between your legs. ? If you have back pain after a night's rest, your bed may be too soft. ? A firm mattress may provide more support for your back during pregnancy. General instructions  Do not wear high heels.  Eat a healthy diet. Try to gain weight within your health care provider's recommendations.  Use a maternity girdle, elastic sling, or back brace as told by your health care provider.  Take over-the-counter and prescription medicines only as told by your health care provider.  Work with a physical therapist or massage therapist to find ways to manage back pain. Acupuncture or massage therapy may be helpful.  Keep all follow-up visits as told by your health care provider. This is important. Contact a health care provider if:  Your back pain interferes with your daily activities.  You have increasing pain in other parts of your body. Get help right away if:  You develop numbness, tingling, weakness, or problems with the use of  your arms or legs.  You develop severe back pain that is not controlled with medicine.  You have a change in bowel or bladder control.  You develop shortness of breath, dizziness, or you faint.  You develop nausea, vomiting, or sweating.  You have back pain that is a rhythmic, cramping pain similar to labor pains. Labor pain is usually 1-2 minutes apart, lasts for about 1 minute, and involves a bearing down feeling or pressure in your pelvis.  You have back pain and your water breaks or  you have vaginal bleeding.  You have back pain or numbness that travels down your leg.  Your back pain developed after you fell.  You develop pain on one side of your back.  You see blood in your urine.  You develop skin blisters in the area of your back pain. Summary  Back pain may be caused by several factors that are related to changes during your pregnancy.  Follow instructions as told by your health care provider for managing pain, stiffness, and swelling.  Exercise as told by your health care provider. Gentle exercise is the best way to prevent or manage back pain.  Take over-the-counter and prescription medicines only as told by your health care provider.  Keep all follow-up visits as told by your health care provider. This is important. This information is not intended to replace advice given to you by your health care provider. Make sure you discuss any questions you have with your health care provider. Document Released: 12/27/2005 Document Revised: 03/06/2018 Document Reviewed: 03/06/2018 Elsevier Interactive Patient Education  2019 [employer] Inc[employer] Round Ligament Pain  The round ligament is a cord of muscle and tissue that helps support the uterus. It can become a source of pain during pregnancy if it becomes stretched or twisted as the baby grows. The pain usually begins in the second trimester (13-28 weeks) of pregnancy, and it can come and go until the baby is delivered. It is not a serious problem, and it does not cause harm to the baby. Round ligament pain is usually a short, sharp, and pinching pain, but it can also be a dull, lingering, and aching pain. The pain is felt in the lower side of the abdomen or in the groin. It usually starts deep in the groin and moves up to the outside of the hip area. The pain may occur when you:  Suddenly change position, such as quickly going from a sitting to standing position.  Roll over in bed.  Cough or sneeze.  Do physical  activity. Follow these instructions at home:   Watch your condition for any changes.  When the pain starts, relax. Then try any of these methods to help with the pain: ? Sitting down. ? Flexing your knees up to your abdomen. ? Lying on your side with one pillow under your abdomen and another pillow between your legs. ? Sitting in a warm bath for 15-20 minutes or until the pain goes away.  Take over-the-counter and prescription medicines only as told by your health care provider.  Move slowly when you sit down or stand up.  Avoid long walks if they cause pain.  Stop or reduce your physical activities if they cause pain.  Keep all follow-up visits as told by your health care provider. This is important. Contact a health care provider if:  Your pain does not go away with treatment.  You feel pain in your back that you did not have before.  Your  medicine is not helping. Get help right away if:  You have a fever or chills.  You develop uterine contractions.  You have vaginal bleeding.  You have nausea or vomiting.  You have diarrhea.  You have pain when you urinate. Summary  Round ligament pain is felt in the lower abdomen or groin. It is usually a short, sharp, and pinching pain. It can also be a dull, lingering, and aching pain.  This pain usually begins in the second trimester (13-28 weeks). It occurs because the uterus is stretching with the growing baby, and it is not harmful to the baby.  You may notice the pain when you suddenly change position, when you cough or sneeze, or during physical activity.  Relaxing, flexing your knees to your abdomen, lying on one side, or taking a warm bath may help to get rid of the pain.  Get help from your health care provider if the pain does not go away or if you have vaginal bleeding, nausea, vomiting, diarrhea, or painful urination. This information is not intended to replace advice given to you by your health care provider.  Make sure you discuss any questions you have with your health care provider. Document Released: 06/27/2008 Document Revised: 03/06/2018 Document Reviewed: 03/06/2018 Elsevier Interactive Patient Education  2019 [employer] Second Trimester of Pregnancy  The second trimester is from week 14 through week 27 (month 4 through 6). This is often the time in pregnancy that you feel your best. Often times, morning sickness has lessened or quit. You may have more energy, and you may get hungry more often. Your unborn baby is growing rapidly. At the end of the sixth month, he or she is about 9 inches long and weighs about 1 pounds. You will likely feel the baby move between 18 and 20 weeks of pregnancy. Follow these instructions at home: Medicines  Take over-the-counter and prescription medicines only as told by your doctor. Some medicines are safe and some medicines are not safe during pregnancy.  Take a prenatal vitamin that contains at least 600 micrograms (mcg) of folic acid.  If you have trouble pooping (constipation), take medicine that will make your stool soft (stool softener) if your doctor approves. Eating and drinking   Eat regular, healthy meals.  Avoid raw meat and uncooked cheese.  If you get low calcium from the food you eat, talk to your doctor about taking a daily calcium supplement.  Avoid foods that are high in fat and sugars, such as fried and sweet foods.  If you feel sick to your stomach (nauseous) or throw up (vomit): ? Eat 4 or 5 small meals a day instead of 3 large meals. ? Try eating a few soda crackers. ? Drink liquids between meals instead of during meals.  To prevent constipation: ? Eat foods that are high in fiber, like fresh fruits and vegetables, whole grains, and beans. ? Drink enough fluids to keep your pee (urine) clear or pale yellow. Activity  Exercise only as told by your doctor. Stop exercising if you start to have cramps.  Do not exercise if it  is too hot, too humid, or if you are in a place of great height (high altitude).  Avoid heavy lifting.  Wear low-heeled shoes. Sit and stand up straight.  You can continue to have sex unless your doctor tells you not to. Relieving pain and discomfort  Wear a good support bra if your breasts are tender.  Take warm water baths (sitz  baths) to soothe pain or discomfort caused by hemorrhoids. Use hemorrhoid cream if your doctor approves.  Rest with your legs raised if you have leg cramps or low back pain.  If you develop puffy, bulging veins (varicose veins) in your legs: ? Wear support hose or compression stockings as told by your doctor. ? Raise (elevate) your feet for 15 minutes, 3-4 times a day. ? Limit salt in your food. Prenatal care  Write down your questions. Take them to your prenatal visits.  Keep all your prenatal visits as told by your doctor. This is important. Safety  Wear your seat belt when driving.  Make a list of emergency phone numbers, including numbers for family, friends, the hospital, and police and fire departments. General instructions  Ask your doctor about the right foods to eat or for help finding a counselor, if you need these services.  Ask your doctor about local prenatal classes. Begin classes before month 6 of your pregnancy.  Do not use hot tubs, steam rooms, or saunas.  Do not douche or use tampons or scented sanitary pads.  Do not cross your legs for long periods of time.  Visit your dentist if you have not done so. Use a soft toothbrush to brush your teeth. Floss gently.  Avoid all smoking, herbs, and alcohol. Avoid drugs that are not approved by your doctor.  Do not use any products that contain nicotine or tobacco, such as cigarettes and e-cigarettes. If you need help quitting, ask your doctor.  Avoid cat litter boxes and soil used by cats. These carry germs that can cause birth defects in the baby and can cause a loss of your baby  (miscarriage) or stillbirth. Contact a doctor if:  You have mild cramps or pressure in your lower belly.  You have pain when you pee (urinate).  You have bad smelling fluid coming from your vagina.  You continue to feel sick to your stomach (nauseous), throw up (vomit), or have watery poop (diarrhea).  You have a nagging pain in your belly area.  You feel dizzy. Get help right away if:  You have a fever.  You are leaking fluid from your vagina.  You have spotting or bleeding from your vagina.  You have severe belly cramping or pain.  You lose or gain weight rapidly.  You have trouble catching your breath and have chest pain.  You notice sudden or extreme puffiness (swelling) of your face, hands, ankles, feet, or legs.  You have not felt the baby move in over an hour.  You have severe headaches that do not go away when you take medicine.  You have trouble seeing. Summary  The second trimester is from week 14 through week 27 (months 4 through 6). This is often the time in pregnancy that you feel your best.  To take care of yourself and your unborn baby, you will need to eat healthy meals, take medicines only if your doctor tells you to do so, and do activities that are safe for you and your baby.  Call your doctor if you get sick or if you notice anything unusual about your pregnancy. Also, call your doctor if you need help with the right food to eat, or if you want to know what activities are safe for you. This information is not intended to replace advice given to you by your health care provider. Make sure you discuss any questions you have with your health care provider. Document Released: 12/13/2009 Document  Revised: 10/24/2016 Document Reviewed: 10/24/2016 Elsevier Interactive Patient Education  [employer].

## 2018-09-24 ENCOUNTER — Encounter: Payer: BC Managed Care – PPO | Admitting: Certified Nurse Midwife

## 2018-10-02 NOTE — L&D Delivery Note (Addendum)
Delivery Note  1524 In room to see patient, states it is time for baby. Effective maternal pushing efforts noted.  Spontaneous vaginal birth of liveborn female infant at 22 in right occiput anterior position. Infant immediately to maternal abdomen. Delayed cord clamping, skin to skin, three (3) vessel cord, and cord blood collected. APGARS: 8, 9. Weight pending. Receiving nurse present at bedside for birth.   IV pitocin infusing. Spontaneous delivery of intact placenta. Large gush blood noted. Uterus firm. Vault check completed. Perineum intact. Lochia small. EBL: 300 ml. Counts correct x 2.   Initiate routine postpartum care and orders. Mom to postpartum.  Baby to Couplet care / Skin to Skin.  FOB present at bedside for birth and overjoyed with the birth of "[name]".   Continue orders as written. Reassess as needed.    Gunnar Bulla, CNM [facility] 02/22/2019, 4:11 PM

## 2018-10-14 ENCOUNTER — Other Ambulatory Visit: Payer: Self-pay | Admitting: Certified Nurse Midwife

## 2018-10-14 DIAGNOSIS — Z3689 Encounter for other specified antenatal screening: Secondary | ICD-10-CM

## 2018-10-15 ENCOUNTER — Encounter: Payer: Self-pay | Admitting: Certified Nurse Midwife

## 2018-10-15 ENCOUNTER — Ambulatory Visit (INDEPENDENT_AMBULATORY_CARE_PROVIDER_SITE_OTHER): Payer: BC Managed Care – PPO | Admitting: Certified Nurse Midwife

## 2018-10-15 ENCOUNTER — Ambulatory Visit (INDEPENDENT_AMBULATORY_CARE_PROVIDER_SITE_OTHER): Payer: BC Managed Care – PPO

## 2018-10-15 VITALS — BP 89/50 | HR 69 | Wt 134.3 lb

## 2018-10-15 DIAGNOSIS — Z3689 Encounter for other specified antenatal screening: Secondary | ICD-10-CM

## 2018-10-15 DIAGNOSIS — Z363 Encounter for antenatal screening for malformations: Secondary | ICD-10-CM

## 2018-10-15 DIAGNOSIS — Z3482 Encounter for supervision of other normal pregnancy, second trimester: Secondary | ICD-10-CM

## 2018-10-15 DIAGNOSIS — Z3A21 21 weeks gestation of pregnancy: Secondary | ICD-10-CM

## 2018-10-15 DIAGNOSIS — Z3480 Encounter for supervision of other normal pregnancy, unspecified trimester: Secondary | ICD-10-CM

## 2018-10-15 NOTE — Patient Instructions (Signed)

## 2018-10-15 NOTE — Progress Notes (Signed)
ROB doing well. Anatomy scan today complete WNL.( See below) results reviewed with pt. Reviewed common discomforts in pregnancy. Pt admits to some back pain, encouraged use of belly band, tylenol, and ice/heat to back. She verbalizes and agrees to plan. Follow up 4 wks.   Doreene Burke, CNM  Patient Name: Tammy Contreras Tzintzun DOB: 1996/07/24 MRN: [id number]  ULTRASOUND REPORT  Location: [facility] Date of Service: 10/15/2018   Indications:Anatomy Ultrasound Findings:  Mason Jim intrauterine pregnancy is visualized with FHR at 141 BPM. Biometrics give an (U/S) Gestational age of [redacted]w[redacted]d and an (U/S) EDD of 02/19/19; this correlates with the clinically established Estimated Date of Delivery: 02/20/19  Fetal presentation is transverse. Fetal head on maternal Lt EFW: wnl. Placenta: anterior. Grade: 0 to the Rt lateral AFI: subjectively normal.  Anatomic survey is complete and normal; Gender - female.    Right Ovary is normal in appearance. Left Ovary is normal appearance. Survey of the adnexa demonstrates no adnexal masses. There is no free peritoneal fluid in the cul de sac.  Impression: 1. [redacted]w[redacted]d Viable Singleton Intrauterine pregnancy by U/S. 2. (U/S) EDD is consistent with Clinically established Estimated Date of Delivery: 02/20/19 . 3. Normal Anatomy Scan  Recommendations: 1.Clinical correlation with the patient's History and Physical Exam. 2.  Abeer Alsammarraie,RDMS

## 2018-10-16 ENCOUNTER — Telehealth: Payer: Self-pay | Admitting: Certified Nurse Midwife

## 2018-10-16 LAB — POCT URINALYSIS DIPSTICK OB
BILIRUBIN UA: NEGATIVE
Glucose, UA: NEGATIVE
Ketones, UA: NEGATIVE
LEUKOCYTES UA: NEGATIVE
Nitrite, UA: NEGATIVE
PH UA: 5 (ref 5.0–8.0)
POC,PROTEIN,UA: NEGATIVE
RBC UA: NEGATIVE
Spec Grav, UA: 1.01 (ref 1.010–1.025)
UROBILINOGEN UA: 0.2 U/dL

## 2018-10-16 NOTE — Telephone Encounter (Signed)
Appointment made for 10/18/18 @ 3PM with MS

## 2018-10-16 NOTE — Telephone Encounter (Signed)
The patient is asking if the triage nurse can call her and give her her results from the urine from her OB appointment from yesterday with Pattricia Boss, please advise, thanks.

## 2018-10-18 ENCOUNTER — Ambulatory Visit (INDEPENDENT_AMBULATORY_CARE_PROVIDER_SITE_OTHER): Payer: BC Managed Care – PPO | Admitting: Obstetrics and Gynecology

## 2018-10-18 VITALS — BP 98/54 | HR 98 | Wt 137.7 lb

## 2018-10-18 DIAGNOSIS — Z3492 Encounter for supervision of normal pregnancy, unspecified, second trimester: Secondary | ICD-10-CM

## 2018-10-18 DIAGNOSIS — B379 Candidiasis, unspecified: Secondary | ICD-10-CM

## 2018-10-18 LAB — POCT URINALYSIS DIPSTICK OB
BILIRUBIN UA: NEGATIVE
Blood, UA: NEGATIVE
Glucose, UA: NEGATIVE
KETONES UA: NEGATIVE
Nitrite, UA: NEGATIVE
PH UA: 6.5 (ref 5.0–8.0)
POC,PROTEIN,UA: NEGATIVE
Spec Grav, UA: 1.015 (ref 1.010–1.025)
UROBILINOGEN UA: 0.2 U/dL

## 2018-10-18 MED ORDER — FLUCONAZOLE 150 MG PO TABS: 150.0000 mg | ORAL_TABLET | Freq: Once | ORAL | 3 refills | Status: AC

## 2018-10-18 NOTE — Progress Notes (Signed)
OB WORK IN- pt was having some vaginal itching, now she is having d/c denies any odor

## 2018-10-18 NOTE — Progress Notes (Signed)
[employer] in [employer]-vaginal itching/burning started about 2 weeks ago, doesn't feel itching right now. H/o urethral cyst.  Microscopic wet-mount exam shows negative for pathogens, normal epithelial cells, hyphae, lactobacilli.

## 2018-10-18 NOTE — Patient Instructions (Signed)
Vaginal Yeast infection, Adult    Vaginal yeast infection is a condition that causes vaginal discharge as well as soreness, swelling, and redness (inflammation) of the vagina. This is a common condition. Some women get this infection frequently.  What are the causes?  This condition is caused by a change in the normal balance of the yeast (candida) and bacteria that live in the vagina. This change causes an overgrowth of yeast, which causes the inflammation.  What increases the risk?  The condition is more likely to develop in women who:   Take antibiotic medicines.   Have diabetes.   Take birth control pills.   Are pregnant.   Douche often.   Have a weak body defense system (immune system).   Have been taking steroid medicines for a long time.   Frequently wear tight clothing.  What are the signs or symptoms?  Symptoms of this condition include:   White, thick, creamy vaginal discharge.   Swelling, itching, redness, and irritation of the vagina. The lips of the vagina (vulva) may be affected as well.   Pain or a burning feeling while urinating.   Pain during sex.  How is this diagnosed?  This condition is diagnosed based on:   Your medical history.   A physical exam.   A pelvic exam. Your health care provider will examine a sample of your vaginal discharge under a microscope. Your health care provider may send this sample for testing to confirm the diagnosis.  How is this treated?  This condition is treated with medicine. Medicines may be over-the-counter or prescription. You may be told to use one or more of the following:   Medicine that is taken by mouth (orally).   Medicine that is applied as a cream (topically).   Medicine that is inserted directly into the vagina (suppository).  Follow these instructions at home:    Lifestyle   Do not have sex until your health care provider approves. Tell your sex partner that you have a yeast infection. That person should go to his or her health care  provider and ask if they should also be treated.   Do not wear tight clothes, such as pantyhose or tight pants.   Wear breathable cotton underwear.  General instructions   Take or apply over-the-counter and prescription medicines only as told by your health care provider.   Eat more yogurt. This may help to keep your yeast infection from returning.   Do not use tampons until your health care provider approves.   Try taking a sitz bath to help with discomfort. This is a warm water bath that is taken while you are sitting down. The water should only come up to your hips and should cover your buttocks. Do this 3-4 times per day or as told by your health care provider.   Do not douche.   If you have diabetes, keep your blood sugar levels under control.   Keep all follow-up visits as told by your health care provider. This is important.  Contact a health care provider if:   You have a fever.   Your symptoms go away and then return.   Your symptoms do not get better with treatment.   Your symptoms get worse.   You have new symptoms.   You develop blisters in or around your vagina.   You have blood coming from your vagina and it is not your menstrual period.   You develop pain in your abdomen.  Summary     Vaginal yeast infection is a condition that causes discharge as well as soreness, swelling, and redness (inflammation) of the vagina.   This condition is treated with medicine. Medicines may be over-the-counter or prescription.   Take or apply over-the-counter and prescription medicines only as told by your health care provider.   Do not douche. Do not have sex or use tampons until your health care provider approves.   Contact a health care provider if your symptoms do not get better with treatment or your symptoms go away and then return.  This information is not intended to replace advice given to you by your health care provider. Make sure you discuss any questions you have with your health care  provider.  Document Released: 06/28/2005 Document Revised: 02/04/2018 Document Reviewed: 02/04/2018  Elsevier Interactive Patient Education  2019 Elsevier Inc.

## 2018-11-13 ENCOUNTER — Ambulatory Visit (INDEPENDENT_AMBULATORY_CARE_PROVIDER_SITE_OTHER): Payer: BC Managed Care – PPO | Admitting: Obstetrics and Gynecology

## 2018-11-13 VITALS — BP 91/47 | HR 75 | Wt 140.4 lb

## 2018-11-13 DIAGNOSIS — Z3492 Encounter for supervision of normal pregnancy, unspecified, second trimester: Secondary | ICD-10-CM | POA: Diagnosis not present

## 2018-11-13 LAB — POCT URINALYSIS DIPSTICK OB
BILIRUBIN UA: NEGATIVE
GLUCOSE, UA: NEGATIVE
Ketones, UA: NEGATIVE
LEUKOCYTES UA: NEGATIVE
Nitrite, UA: NEGATIVE
POC,PROTEIN,UA: NEGATIVE
RBC UA: NEGATIVE
SPEC GRAV UA: 1.01 (ref 1.010–1.025)
Urobilinogen, UA: 0.2 E.U./dL
pH, UA: 5 (ref 5.0–8.0)

## 2018-11-13 NOTE — Progress Notes (Signed)
ROB- doing well, glucola next visit;having low back pain- ok to see chiropractor and massage,

## 2018-12-02 ENCOUNTER — Other Ambulatory Visit (INDEPENDENT_AMBULATORY_CARE_PROVIDER_SITE_OTHER): Payer: BC Managed Care – PPO

## 2018-12-02 ENCOUNTER — Ambulatory Visit (INDEPENDENT_AMBULATORY_CARE_PROVIDER_SITE_OTHER): Payer: BC Managed Care – PPO | Admitting: Certified Nurse Midwife

## 2018-12-02 VITALS — BP 106/66 | HR 78 | Wt 142.1 lb

## 2018-12-02 DIAGNOSIS — Z130 Encounter for screening for diseases of the blood and blood-forming organs and certain disorders involving the immune mechanism: Secondary | ICD-10-CM

## 2018-12-02 DIAGNOSIS — Z131 Encounter for screening for diabetes mellitus: Secondary | ICD-10-CM

## 2018-12-02 DIAGNOSIS — Z23 Encounter for immunization: Secondary | ICD-10-CM

## 2018-12-02 DIAGNOSIS — Z3493 Encounter for supervision of normal pregnancy, unspecified, third trimester: Secondary | ICD-10-CM

## 2018-12-02 LAB — POCT URINALYSIS DIPSTICK OB
Bilirubin, UA: NEGATIVE
Blood, UA: NEGATIVE
GLUCOSE, UA: NEGATIVE
Ketones, UA: NEGATIVE
Leukocytes, UA: NEGATIVE
Nitrite, UA: NEGATIVE
POC,PROTEIN,UA: NEGATIVE
Spec Grav, UA: 1.01 (ref 1.010–1.025)
Urobilinogen, UA: 0.2 E.U./dL
pH, UA: 6 (ref 5.0–8.0)

## 2018-12-02 MED ORDER — TETANUS-DIPHTH-ACELL PERTUSSIS 5-2.5-18.5 LF-MCG/0.5 IM SUSP
0.5000 mL | Freq: Once | INTRAMUSCULAR | Status: AC
  Administered 2018-12-02: 0.5 mL via INTRAMUSCULAR

## 2018-12-02 NOTE — Progress Notes (Signed)
ROB-Reports left nipple cracking. Discussed home treatment measures. Samples of Lanolin given. 28 week labs today. TDaP reviewed and signed. Third trimester education with handouts given. Anticipatory guidance regarding course of prenatal care. Reviewed red flag symptoms and when to call. RTC x 2 weeks for ROB or sooner if needed.

## 2018-12-02 NOTE — Progress Notes (Signed)
ROB- glucola done, blood consent signed, pt is doing well 

## 2018-12-02 NOTE — Patient Instructions (Addendum)
Vaginal Delivery  Vaginal delivery means that you give birth by pushing your baby out of your birth canal (vagina). A team of health care providers will help you before, during, and after vaginal delivery. Birth experiences are unique for every woman and every pregnancy, and birth experiences vary depending on where you choose to give birth. What happens when I arrive at the birth center or hospital? Once you are in labor and have been admitted into the hospital or birth center, your health care provider may:  Review your pregnancy history and any concerns that you have.  Insert an IV into one of your veins. This may be used to give you fluids and medicines.  Check your blood pressure, pulse, temperature, and heart rate (vital signs).  Check whether your bag of water (amniotic sac) has broken (ruptured).  Talk with you about your birth plan and discuss pain control options. Monitoring Your health care provider may monitor your contractions (uterine monitoring) and your baby's heart rate (fetal monitoring). You may need to be monitored:  Often, but not continuously (intermittently).  All the time or for long periods at a time (continuously). Continuous monitoring may be needed if: ? You are taking certain medicines, such as medicine to relieve pain or make your contractions stronger. ? You have pregnancy or labor complications. Monitoring may be done by:  Placing a special stethoscope or a handheld monitoring device on your abdomen to check your baby's heartbeat and to check for contractions.  Placing monitors on your abdomen (external monitors) to record your baby's heartbeat and the frequency and length of contractions.  Placing monitors inside your uterus through your vagina (internal monitors) to record your baby's heartbeat and the frequency, length, and strength of your contractions. Depending on the type of monitor, it may remain in your uterus or on your baby's head until  birth.  Telemetry. This is a type of continuous monitoring that can be done with external or internal monitors. Instead of having to stay in bed, you are able to move around during telemetry. Physical exam Your health care provider may perform frequent physical exams. This may include:  Checking how and where your baby is positioned in your uterus.  Checking your cervix to determine: ? Whether it is thinning out (effacing). ? Whether it is opening up (dilating). What happens during labor and delivery?  Normal labor and delivery is divided into the following three stages: Stage 1  This is the longest stage of labor.  This stage can last for hours or days.  Throughout this stage, you will feel contractions. Contractions generally feel mild, infrequent, and irregular at first. They get stronger, more frequent (about every 2-3 minutes), and more regular as you move through this stage.  This stage ends when your cervix is completely dilated to 4 inches (10 cm) and completely effaced. Stage 2  This stage starts once your cervix is completely effaced and dilated and lasts until the delivery of your baby.  This stage may last from 20 minutes to 2 hours.  This is the stage where you will feel an urge to push your baby out of your vagina.  You may feel stretching and burning pain, especially when the widest part of your baby's head passes through the vaginal opening (crowning).  Once your baby is delivered, the umbilical cord will be clamped and cut. This usually occurs after waiting a period of 1-2 minutes after delivery.  Your baby will be placed on your bare chest (  skin-to-skin contact) in an upright position and covered with a warm blanket. Watch your baby for feeding cues, like rooting or sucking, and help the baby to your breast for his or her first feeding. Stage 3  This stage starts immediately after the birth of your baby and ends after you deliver the placenta.  This stage may  take anywhere from 5 to 30 minutes.  After your baby has been delivered, you will feel contractions as your body expels the placenta and your uterus contracts to control bleeding. What can I expect after labor and delivery?  After labor is over, you and your baby will be monitored closely until you are ready to go home to ensure that you are both healthy. Your health care team will teach you how to care for yourself and your baby.  You and your baby will stay in the same room (rooming in) during your hospital stay. This will encourage early bonding and successful breastfeeding.  You may continue to receive fluids and medicines through an IV.  Your uterus will be checked and massaged regularly (fundal massage).  You will have some soreness and pain in your abdomen, vagina, and the area of skin between your vaginal opening and your anus (perineum).  If an incision was made near your vagina (episiotomy) or if you had some vaginal tearing during delivery, cold compresses may be placed on your episiotomy or your tear. This helps to reduce pain and swelling.  You may be given a squirt bottle to use instead of wiping when you go to the bathroom. To use the squirt bottle, follow these steps: ? Before you urinate, fill the squirt bottle with warm water. Do not use hot water. ? After you urinate, while you are sitting on the toilet, use the squirt bottle to rinse the area around your urethra and vaginal opening. This rinses away any urine and blood. ? Fill the squirt bottle with clean water every time you use the bathroom.  It is normal to have vaginal bleeding after delivery. Wear a sanitary pad for vaginal bleeding and discharge. Summary  Vaginal delivery means that you will give birth by pushing your baby out of your birth canal (vagina).  Your health care provider may monitor your contractions (uterine monitoring) and your baby's heart rate (fetal monitoring).  Your health care provider may  perform a physical exam.  Normal labor and delivery is divided into three stages.  After labor is over, you and your baby will be monitored closely until you are ready to go home. This information is not intended to replace advice given to you by your health care provider. Make sure you discuss any questions you have with your health care provider. Document Released: 06/27/2008 Document Revised: 10/23/2017 Document Reviewed: 10/23/2017 Elsevier Interactive Patient Education  2019 [employer]. Round Ligament Pain  The round ligament is a cord of muscle and tissue that helps support the uterus. It can become a source of pain during pregnancy if it becomes stretched or twisted as the baby grows. The pain usually begins in the second trimester (13-28 weeks) of pregnancy, and it can come and go until the baby is delivered. It is not a serious problem, and it does not cause harm to the baby. Round ligament pain is usually a short, sharp, and pinching pain, but it can also be a dull, lingering, and aching pain. The pain is felt in the lower side of the abdomen or in the groin. It usually starts  deep in the groin and moves up to the outside of the hip area. The pain may occur when you:  Suddenly change position, such as quickly going from a sitting to standing position.  Roll over in bed.  Cough or sneeze.  Do physical activity. Follow these instructions at home:   Watch your condition for any changes.  When the pain starts, relax. Then try any of these methods to help with the pain: ? Sitting down. ? Flexing your knees up to your abdomen. ? Lying on your side with one pillow under your abdomen and another pillow between your legs. ? Sitting in a warm bath for 15-20 minutes or until the pain goes away.  Take over-the-counter and prescription medicines only as told by your health care provider.  Move slowly when you sit down or stand up.  Avoid long walks if they cause pain.  Stop or  reduce your physical activities if they cause pain.  Keep all follow-up visits as told by your health care provider. This is important. Contact a health care provider if:  Your pain does not go away with treatment.  You feel pain in your back that you did not have before.  Your medicine is not helping. Get help right away if:  You have a fever or chills.  You develop uterine contractions.  You have vaginal bleeding.  You have nausea or vomiting.  You have diarrhea.  You have pain when you urinate. Summary  Round ligament pain is felt in the lower abdomen or groin. It is usually a short, sharp, and pinching pain. It can also be a dull, lingering, and aching pain.  This pain usually begins in the second trimester (13-28 weeks). It occurs because the uterus is stretching with the growing baby, and it is not harmful to the baby.  You may notice the pain when you suddenly change position, when you cough or sneeze, or during physical activity.  Relaxing, flexing your knees to your abdomen, lying on one side, or taking a warm bath may help to get rid of the pain.  Get help from your health care provider if the pain does not go away or if you have vaginal bleeding, nausea, vomiting, diarrhea, or painful urination. This information is not intended to replace advice given to you by your health care provider. Make sure you discuss any questions you have with your health care provider. Document Released: 06/27/2008 Document Revised: 03/06/2018 Document Reviewed: 03/06/2018 Elsevier Interactive Patient Education  2019 [employer]. Abdominal Pain During Pregnancy  Belly (abdominal) pain is common during pregnancy. There are many possible causes. Most of the time, it is not a serious problem. Other times, it can be a sign that something is wrong with the pregnancy. Always tell your doctor if you have belly pain. Follow these instructions at home:  Do not have sex or put anything in your  vagina until your pain goes away completely.  Get plenty of rest until your pain gets better.  Drink enough fluid to keep your pee (urine) pale yellow.  Take over-the-counter and prescription medicines only as told by your doctor.  Keep all follow-up visits as told by your doctor. This is important. Contact a doctor if:  Your pain continues or gets worse after resting.  You have lower belly pain that: ? Comes and goes at regular times. ? Spreads to your back. ? Feels like menstrual cramps.  You have pain or burning when you pee (urinate). Get help  right away if:  You have a fever or chills.  You have vaginal bleeding.  You are leaking fluid from your vagina.  You are passing tissue from your vagina.  You throw up (vomit) for more than 24 hours.  You have watery poop (diarrhea) for more than 24 hours.  Your baby is moving less than usual.  You feel very weak or faint.  You have shortness of breath.  You have very bad pain in your upper belly. Summary  Belly (abdominal) pain is common during pregnancy. There are many possible causes.  If you have belly pain during pregnancy, tell your doctor right away.  Keep all follow-up visits as told by your doctor. This is important. This information is not intended to replace advice given to you by your health care provider. Make sure you discuss any questions you have with your health care provider. Document Released: 09/06/2009 Document Revised: 12/21/2016 Document Reviewed: 12/21/2016 Elsevier Interactive Patient Education  2019 [employer] Back Pain in Pregnancy Back pain during pregnancy is common. Back pain may be caused by several factors that are related to changes during your pregnancy. Follow these instructions at home: Managing pain, stiffness, and swelling      If directed, for sudden (acute) back pain, put ice on the painful area. ? Put ice in a plastic bag. ? Place a towel between your skin and the  bag. ? Leave the ice on for 20 minutes, 2-3 times per day.  If directed, apply heat to the affected area before you exercise. Use the heat source that your health care provider recommends, such as a moist heat pack or a heating pad. ? Place a towel between your skin and the heat source. ? Leave the heat on for 20-30 minutes. ? Remove the heat if your skin turns bright red. This is especially important if you are unable to feel pain, heat, or cold. You may have a greater risk of getting burned.  If directed, massage the affected area. Activity  Exercise as told by your health care provider. Gentle exercise is the best way to prevent or manage back pain.  Listen to your body when lifting. If lifting hurts, ask for help or bend your knees. This uses your leg muscles instead of your back muscles.  Squat down when picking up something from the floor. Do not bend over.  Only use bed rest for short periods as told by your health care provider. Bed rest should only be used for the most severe episodes of back pain. Standing, sitting, and lying down  Do not stand in one place for long periods of time.  Use good posture when sitting. Make sure your head rests over your shoulders and is not hanging forward. Use a pillow on your lower back if necessary.  Try sleeping on your side, preferably the left side, with a pregnancy support pillow or 1-2 regular pillows between your legs. ? If you have back pain after a night's rest, your bed may be too soft. ? A firm mattress may provide more support for your back during pregnancy. General instructions  Do not wear high heels.  Eat a healthy diet. Try to gain weight within your health care provider's recommendations.  Use a maternity girdle, elastic sling, or back brace as told by your health care provider.  Take over-the-counter and prescription medicines only as told by your health care provider.  Work with a physical therapist or massage therapist  to find ways to  manage back pain. Acupuncture or massage therapy may be helpful.  Keep all follow-up visits as told by your health care provider. This is important. Contact a health care provider if:  Your back pain interferes with your daily activities.  You have increasing pain in other parts of your body. Get help right away if:  You develop numbness, tingling, weakness, or problems with the use of your arms or legs.  You develop severe back pain that is not controlled with medicine.  You have a change in bowel or bladder control.  You develop shortness of breath, dizziness, or you faint.  You develop nausea, vomiting, or sweating.  You have back pain that is a rhythmic, cramping pain similar to labor pains. Labor pain is usually 1-2 minutes apart, lasts for about 1 minute, and involves a bearing down feeling or pressure in your pelvis.  You have back pain and your water breaks or you have vaginal bleeding.  You have back pain or numbness that travels down your leg.  Your back pain developed after you fell.  You develop pain on one side of your back.  You see blood in your urine.  You develop skin blisters in the area of your back pain. Summary  Back pain may be caused by several factors that are related to changes during your pregnancy.  Follow instructions as told by your health care provider for managing pain, stiffness, and swelling.  Exercise as told by your health care provider. Gentle exercise is the best way to prevent or manage back pain.  Take over-the-counter and prescription medicines only as told by your health care provider.  Keep all follow-up visits as told by your health care provider. This is important. This information is not intended to replace advice given to you by your health care provider. Make sure you discuss any questions you have with your health care provider. Document Released: 12/27/2005 Document Revised: 03/06/2018 Document Reviewed:  03/06/2018 Elsevier Interactive Patient Education  2019 [employer] WHAT OB PATIENTS CAN EXPECT   Confirmation of pregnancy and ultrasound ordered if medically indicated-[redacted] weeks gestation  New OB (NOB) intake with nurse and New OB (NOB) labs- [redacted] weeks gestation  New OB (NOB) physical examination with provider- 11/[redacted] weeks gestation  Flu vaccine-[redacted] weeks gestation  Anatomy scan-[redacted] weeks gestation  Glucose tolerance test, blood work to test for anemia, T-dap vaccine-[redacted] weeks gestation  Vaginal swabs/cultures-STD/Group B strep-[redacted] weeks gestation  Appointments every 4 weeks until 28 weeks  Every 2 weeks from 28 weeks until 36 weeks  Weekly visits from 36 weeks until delivery  Common Medications Safe in Pregnancy  Acne:      Constipation:  Benzoyl Peroxide     Colace  Clindamycin      Dulcolax Suppository  Topica Erythromycin     Fibercon  Salicylic Acid      Metamucil         Miralax AVOID:        Senakot   Accutane    Cough:  Retin-A       Cough Drops  Tetracycline      Phenergan w/ Codeine if Rx  Minocycline      Robitussin (Plain & DM)  Antibiotics:     Crabs/Lice:  Ceclor       RID  Cephalosporins    AVOID:  E-Mycins      Kwell  Keflex  Macrobid/Macrodantin   Diarrhea:  Penicillin      Kao-Pectate  Zithromax  Imodium AD         PUSH FLUIDS AVOID:       Cipro     Fever:  Tetracycline      Tylenol (Regular or Extra  Minocycline       Strength)  Levaquin      Extra Strength-Do not          Exceed 8 tabs/24 hrs Caffeine:        '200mg'$ /day (equiv. To 1 cup of coffee or  approx. 3 12 oz sodas)         Gas: Cold/Hayfever:       Gas-X  Benadryl      Mylicon  Claritin       Phazyme  **Claritin-D        Chlor-Trimeton    Headaches:  Dimetapp      ASA-Free Excedrin  Drixoral-Non-Drowsy     Cold Compress  Mucinex (Guaifenasin)     Tylenol (Regular or Extra  Sudafed/Sudafed-12 Hour     Strength)  **Sudafed PE Pseudoephedrine   Tylenol Cold &  Sinus     Vicks Vapor Rub  Zyrtec  **AVOID if Problems With Blood Pressure         Heartburn: Avoid lying down for at least 1 hour after meals  Aciphex      Maalox     Rash:  Milk of Magnesia     Benadryl    Mylanta       1% Hydrocortisone Cream  Pepcid  Pepcid Complete   Sleep Aids:  Prevacid      Ambien   Prilosec       Benadryl  Rolaids       Chamomile Tea  Tums (Limit 4/day)     Unisom  Zantac       Tylenol PM         Warm milk-add vanilla or  Hemorrhoids:       Sugar for taste  Anusol/Anusol H.C.  (RX: Analapram 2.5%)  Sugar Substitutes:  Hydrocortisone OTC     Ok in moderation  Preparation H      Tucks        Vaseline lotion applied to tissue with wiping    Herpes:     Throat:  Acyclovir      Oragel  Famvir  Valtrex     Vaccines:         Flu Shot Leg Cramps:       *Gardasil  Benadryl      Hepatitis A         Hepatitis B Nasal Spray:       Pneumovax  Saline Nasal Spray     Polio Booster         Tetanus Nausea:       Tuberculosis test or PPD  Vitamin B6 25 mg TID   AVOID:    Dramamine      *Gardasil  Emetrol       Live Poliovirus  Ginger Root 250 mg QID    MMR (measles, mumps &  High Complex Carbs @ Bedtime    rebella)  Sea Bands-Accupressure    Varicella (Chickenpox)  Unisom 1/2 tab TID     *No known complications           If received before Pain:         Known pregnancy;   Darvocet       Resume series after  Lortab        Delivery  Percocet  Yeast:   Tramadol      Femstat  Tylenol 3      Gyne-lotrimin  Ultram       Monistat  Vicodin           MISC:         All Sunscreens           Hair Coloring/highlights          Insect Repellant's          (Including DEET)         Mystic Tans Third Trimester of Pregnancy  The third trimester is from week 28 through week 40 (months 7 through 9). This trimester is when your unborn baby (fetus) is growing very fast. At the end of the ninth month, the unborn baby is about 20 inches in length. It weighs  about 6-10 pounds. Follow these instructions at home: Medicines  Take over-the-counter and prescription medicines only as told by your doctor. Some medicines are safe and some medicines are not safe during pregnancy.  Take a prenatal vitamin that contains at least 600 micrograms (mcg) of folic acid.  If you have trouble pooping (constipation), take medicine that will make your stool soft (stool softener) if your doctor approves. Eating and drinking   Eat regular, healthy meals.  Avoid raw meat and uncooked cheese.  If you get low calcium from the food you eat, talk to your doctor about taking a daily calcium supplement.  Eat four or five small meals rather than three large meals a day.  Avoid foods that are high in fat and sugars, such as fried and sweet foods.  To prevent constipation: ? Eat foods that are high in fiber, like fresh fruits and vegetables, whole grains, and beans. ? Drink enough fluids to keep your pee (urine) clear or pale yellow. Activity  Exercise only as told by your doctor. Stop exercising if you start to have cramps.  Avoid heavy lifting, wear low heels, and sit up straight.  Do not exercise if it is too hot, too humid, or if you are in a place of great height (high altitude).  You may continue to have sex unless your doctor tells you not to. Relieving pain and discomfort  Wear a good support bra if your breasts are tender.  Take frequent breaks and rest with your legs raised if you have leg cramps or low back pain.  Take warm water baths (sitz baths) to soothe pain or discomfort caused by hemorrhoids. Use hemorrhoid cream if your doctor approves.  If you develop puffy, bulging veins (varicose veins) in your legs: ? Wear support hose or compression stockings as told by your doctor. ? Raise (elevate) your feet for 15 minutes, 3-4 times a day. ? Limit salt in your food. Safety  Wear your seat belt when driving.  Make a list of emergency phone  numbers, including numbers for family, friends, the hospital, and police and fire departments. Preparing for your baby's arrival To prepare for the arrival of your baby:  Take prenatal classes.  Practice driving to the hospital.  Visit the hospital and tour the maternity area.  Talk to your work about taking leave once the baby comes.  Pack your hospital bag.  Prepare the baby's room.  Go to your doctor visits.  Buy a rear-facing car seat. Learn how to install it in your car. General instructions  Do not use hot tubs, steam rooms, or saunas.  Do not use any products that  contain nicotine or tobacco, such as cigarettes and e-cigarettes. If you need help quitting, ask your doctor.  Do not drink alcohol.  Do not douche or use tampons or scented sanitary pads.  Do not cross your legs for long periods of time.  Do not travel for long distances unless you must. Only do so if your doctor says it is okay.  Visit your dentist if you have not gone during your pregnancy. Use a soft toothbrush to brush your teeth. Be gentle when you floss.  Avoid cat litter boxes and soil used by cats. These carry germs that can cause birth defects in the baby and can cause a loss of your baby (miscarriage) or stillbirth.  Keep all your prenatal visits as told by your doctor. This is important. Contact a doctor if:  You are not sure if you are in labor or if your water has broken.  You are dizzy.  You have mild cramps or pressure in your lower belly.  You have a nagging pain in your belly area.  You continue to feel sick to your stomach, you throw up, or you have watery poop.  You have bad smelling fluid coming from your vagina.  You have pain when you pee. Get help right away if:  You have a fever.  You are leaking fluid from your vagina.  You are spotting or bleeding from your vagina.  You have severe belly cramps or pain.  You lose or gain weight quickly.  You have trouble  catching your breath and have chest pain.  You notice sudden or extreme puffiness (swelling) of your face, hands, ankles, feet, or legs.  You have not felt the baby move in over an hour.  You have severe headaches that do not go away with medicine.  You have trouble seeing.  You are leaking, or you are having a gush of fluid, from your vagina before you are 37 weeks.  You have regular belly spasms (contractions) before you are 37 weeks. Summary  The third trimester is from week 28 through week 40 (months 7 through 9). This time is when your unborn baby is growing very fast.  Follow your doctor's advice about medicine, food, and activity.  Get ready for the arrival of your baby by taking prenatal classes, getting all the baby items ready, preparing the baby's room, and visiting your doctor to be checked.  Get help right away if you are bleeding from your vagina, or you have chest pain and trouble catching your breath, or if you have not felt your baby move in over an hour. This information is not intended to replace advice given to you by your health care provider. Make sure you discuss any questions you have with your health care provider. Document Released: 12/13/2009 Document Revised: 10/24/2016 Document Reviewed: 10/24/2016 Elsevier Interactive Patient Education  2019 [employer].

## 2018-12-03 LAB — CBC
Hematocrit: 33.2 % — ABNORMAL LOW (ref 34.0–46.6)
Hemoglobin: 11.6 g/dL (ref 11.1–15.9)
MCH: 33.6 pg — ABNORMAL HIGH (ref 26.6–33.0)
MCHC: 34.9 g/dL (ref 31.5–35.7)
MCV: 96 fL (ref 79–97)
Platelets: 186 10*3/uL (ref 150–450)
RBC: 3.45 x10E6/uL — ABNORMAL LOW (ref 3.77–5.28)
RDW: 12.3 % (ref 11.7–15.4)
WBC: 6.8 10*3/uL (ref 3.4–10.8)

## 2018-12-03 LAB — GLUCOSE, 1 HOUR GESTATIONAL: GESTATIONAL DIABETES SCREEN: 110 mg/dL (ref 65–139)

## 2018-12-03 LAB — RPR: RPR Ser Ql: NONREACTIVE

## 2018-12-11 ENCOUNTER — Encounter: Payer: Self-pay | Admitting: Certified Nurse Midwife

## 2018-12-12 ENCOUNTER — Encounter: Payer: BC Managed Care – PPO | Admitting: Certified Nurse Midwife

## 2018-12-16 ENCOUNTER — Ambulatory Visit: Payer: BC Managed Care – PPO | Admitting: Certified Nurse Midwife

## 2018-12-16 ENCOUNTER — Encounter: Payer: Self-pay | Admitting: Certified Nurse Midwife

## 2018-12-16 ENCOUNTER — Other Ambulatory Visit: Payer: Self-pay

## 2018-12-16 VITALS — BP 92/48 | HR 77 | Wt 143.2 lb

## 2018-12-16 DIAGNOSIS — Z3493 Encounter for supervision of normal pregnancy, unspecified, third trimester: Secondary | ICD-10-CM

## 2018-12-16 LAB — POCT URINALYSIS DIPSTICK OB
BILIRUBIN UA: NEGATIVE
Blood, UA: NEGATIVE
Glucose, UA: NEGATIVE
Ketones, UA: NEGATIVE
Leukocytes, UA: NEGATIVE
Nitrite, UA: NEGATIVE
POC,PROTEIN,UA: NEGATIVE
Spec Grav, UA: 1.015 (ref 1.010–1.025)
Urobilinogen, UA: 0.2 E.U./dL
pH, UA: 7.5 (ref 5.0–8.0)

## 2018-12-16 NOTE — Patient Instructions (Signed)
How a Baby Grows During Pregnancy    Pregnancy begins when a female's sperm enters a female's egg (fertilization). Fertilization usually happens in one of the tubes (fallopian tubes) that connect the ovaries to the womb (uterus). The fertilized egg moves down the fallopian tube to the uterus. Once it reaches the uterus, it implants into the lining of the uterus and begins to grow.  For the first 10 weeks, the fertilized egg is called an embryo. After 10 weeks, it is called a fetus. As the fetus continues to grow, it receives oxygen and nutrients through tissue (placenta) that grows to support the developing baby. The placenta is the life support system for the baby. It provides oxygen and nutrition and removes waste.  Learning as much as you can about your pregnancy and how your baby is developing can help you enjoy the experience. It can also make you aware of when there might be a problem and when to ask questions.  How long does a typical pregnancy last?  A pregnancy usually lasts 280 days, or about 40 weeks. Pregnancy is divided into three periods of growth, also called trimesters:   First trimester: 0-12 weeks.   Second trimester: 13-27 weeks.   Third trimester: 28-40 weeks.  The day when your baby is ready to be born (full term) is your estimated date of delivery.  How does my baby develop month by month?  First month   The fertilized egg attaches to the inside of the uterus.   Some cells will form the placenta. Others will form the fetus.   The arms, legs, brain, spinal cord, lungs, and heart begin to develop.   At the end of the first month, the heart begins to beat.  Second month   The bones, inner ear, eyelids, hands, and feet form.   The genitals develop.   By the end of 8 weeks, all major organs are developing.  Third month   All of the internal organs are forming.   Teeth develop below the gums.   Bones and muscles begin to grow. The spine can flex.   The skin is transparent.   Fingernails  and toenails begin to form.   Arms and legs continue to grow longer, and hands and feet develop.   The fetus is about 3 inches (7.6 cm) long.  Fourth month   The placenta is completely formed.   The external sex organs, neck, outer ear, eyebrows, eyelids, and fingernails are formed.   The fetus can hear, swallow, and move its arms and legs.   The kidneys begin to produce urine.   The skin is covered with a white, waxy coating (vernix) and very fine hair (lanugo).  Fifth month   The fetus moves around more and can be felt for the first time (quickening).   The fetus starts to sleep and wake up and may begin to suck its finger.   The nails grow to the end of the fingers.   The organ in the digestive system that makes bile (gallbladder) functions and helps to digest nutrients.   If your baby is a girl, eggs are present in her ovaries. If your baby is a boy, testicles start to move down into his scrotum.  Sixth month   The lungs are formed.   The eyes open. The brain continues to develop.   Your baby has fingerprints and toe prints. Your baby's hair grows thicker.   At the end of the second trimester, the   fetus is about 9 inches (22.9 cm) long.  Seventh month   The fetus kicks and stretches.   The eyes are developed enough to sense changes in light.   The hands can make a grasping motion.   The fetus responds to sound.  Eighth month   All organs and body systems are fully developed and functioning.   Bones harden, and taste buds develop. The fetus may hiccup.   Certain areas of the brain are still developing. The skull remains soft.  Ninth month   The fetus gains about  lb (0.23 kg) each week.   The lungs are fully developed.   Patterns of sleep develop.   The fetus's head typically moves into a head-down position (vertex) in the uterus to prepare for birth.   The fetus weighs 6-9 lb (2.72-4.08 kg) and is 19-20 inches (48.26-50.8 cm) long.  What can I do to have a healthy pregnancy and help  my baby develop?  General instructions   Take prenatal vitamins as directed by your health care provider. These include vitamins such as folic acid, iron, calcium, and vitamin D. They are important for healthy development.   Take medicines only as directed by your health care provider. Read labels and ask a pharmacist or your health care provider whether over-the-counter medicines, supplements, and prescription drugs are safe to take during pregnancy.   Keep all follow-up visits as directed by your health care provider. This is important. Follow-up visits include prenatal care and screening tests.  How do I know if my baby is developing well?  At each prenatal visit, your health care provider will do several different tests to check on your health and keep track of your baby's development. These include:   Fundal height and position.  ? Your health care provider will measure your growing belly from your pubic bone to the top of the uterus using a tape measure.  ? Your health care provider will also feel your belly to determine your baby's position.   Heartbeat.  ? An ultrasound in the first trimester can confirm pregnancy and show a heartbeat, depending on how far along you are.  ? Your health care provider will check your baby's heart rate at every prenatal visit.   Second trimester ultrasound.  ? This ultrasound checks your baby's development. It also may show your baby's gender.  What should I do if I have concerns about my baby's development?  Always talk with your health care provider about any concerns that you may have about your pregnancy and your baby.  Summary   A pregnancy usually lasts 280 days, or about 40 weeks. Pregnancy is divided into three periods of growth, also called trimesters.   Your health care provider will monitor your baby's growth and development throughout your pregnancy.   Follow your health care provider's recommendations about taking prenatal vitamins and medicines during  your pregnancy.   Talk with your health care provider if you have any concerns about your pregnancy or your developing baby.  This information is not intended to replace advice given to you by your health care provider. Make sure you discuss any questions you have with your health care provider.  Document Released: 03/06/2008 Document Revised: 08/01/2017 Document Reviewed: 08/01/2017  Elsevier Interactive Patient Education  2019 Elsevier Inc.

## 2018-12-16 NOTE — Progress Notes (Signed)
ROB doing well. Pt state she was sick last week with fever . She went to urgent care and was negative for flu. She was told that it was a virus. She is feeling better today but state she has sore throat. Medication list given . Discussed self help measures. She feels good movement. PTL precautions reviewed. Follow up 2 wks.   Doreene Burke, CNM

## 2019-01-01 ENCOUNTER — Encounter: Payer: BC Managed Care – PPO | Admitting: Certified Nurse Midwife

## 2019-01-28 ENCOUNTER — Telehealth: Payer: Self-pay

## 2019-01-28 NOTE — Telephone Encounter (Signed)
Coronavirus (COVID-19) Are you at risk?  Are you at risk for the Coronavirus (COVID-19)?  To be considered HIGH RISK for Coronavirus (COVID-19), you have to meet the following criteria:  . Traveled to [place], [place], [place], [place] or [place]; or in the [place] to [place], [place], [place], or [place]; and have fever, cough, and shortness of breath within the last 2 weeks of travel OR . Been in close contact with a person diagnosed with COVID-19 within the last 2 weeks and have fever, cough, and shortness of breath . IF YOU DO NOT MEET THESE CRITERIA, YOU ARE CONSIDERED LOW RISK FOR COVID-19.  What to do if you are HIGH RISK for COVID-19?  . If you are having a medical emergency, call 911. . Seek medical care right away. Before you go to a doctor's office, urgent care or emergency department, call ahead and tell them about your recent travel, contact with someone diagnosed with COVID-19, and your symptoms. You should receive instructions from your physician's office regarding next steps of care.  . When you arrive at healthcare provider, tell the healthcare staff immediately you have returned from visiting [place], [place], [place], [place] or [place]; or traveled in the [place] to [place], [place], [place], or [place]; in the last two weeks or you have been in close contact with a person diagnosed with COVID-19 in the last 2 weeks.   . Tell the health care staff about your symptoms: fever, cough and shortness of breath. . After you have been seen by a medical provider, you will be either: o Tested for (COVID-19) and discharged home on quarantine except to seek medical care if symptoms worsen, and asked to  - Stay home and avoid contact with others until you get your results (4-5 days)  - Avoid travel on public transportation if possible (such as bus, train, or airplane) or o Sent to the Emergency Department by EMS for evaluation, COVID-19 testing, and possible  admission depending on your condition and test results.  What to do if you are LOW RISK for COVID-19?  Reduce your risk of any infection by using the same precautions used for avoiding the common cold or flu:  . Wash your hands often with soap and warm water for at least 20 seconds.  If soap and water are not readily available, use an alcohol-based hand sanitizer with at least 60% alcohol.  . If coughing or sneezing, cover your mouth and nose by coughing or sneezing into the elbow areas of your shirt or coat, into a tissue or into your sleeve (not your hands). . Avoid shaking hands with others and consider head nods or verbal greetings only. . Avoid touching your eyes, nose, or mouth with unwashed hands.  . Avoid close contact with people who are sick. . Avoid places or events with large numbers of people in one location, like concerts or sporting events. . Carefully consider travel plans you have or are making. . If you are planning any travel outside or inside the [place], visit the CDC's Travelers' Health webpage for the latest health notices. . If you have some symptoms but not all symptoms, continue to monitor at home and seek medical attention if your symptoms worsen. . If you are having a medical emergency, call 911.   ADDITIONAL HEALTHCARE OPTIONS FOR PATIENTS  [facility] / e-Visit: [email or web address]         [facility]: [phone]  [facility]: [phone]                   [facility]: [phone]   Pre-screen negative, DM.   

## 2019-01-29 ENCOUNTER — Ambulatory Visit: Payer: BC Managed Care – PPO | Admitting: Certified Nurse Midwife

## 2019-01-29 ENCOUNTER — Telehealth: Payer: Self-pay

## 2019-01-29 ENCOUNTER — Other Ambulatory Visit: Payer: Self-pay

## 2019-01-29 ENCOUNTER — Encounter: Payer: Self-pay | Admitting: Certified Nurse Midwife

## 2019-01-29 VITALS — BP 98/57 | HR 71 | Wt 155.1 lb

## 2019-01-29 DIAGNOSIS — Z3493 Encounter for supervision of normal pregnancy, unspecified, third trimester: Secondary | ICD-10-CM

## 2019-01-29 DIAGNOSIS — N644 Mastodynia: Secondary | ICD-10-CM

## 2019-01-29 NOTE — Addendum Note (Signed)
Addended by: Brooke Dare on: 01/29/2019 02:22 PM   Modules accepted: Orders

## 2019-01-29 NOTE — Addendum Note (Signed)
Addended by: Brooke Dare on: 01/29/2019 03:54 PM   Modules accepted: Orders

## 2019-01-29 NOTE — Progress Notes (Signed)
ROB doing well. Has cracking , bleeding , and pain on nipples. Swab today. Suspect yeast. Reviewed self help measures. GBS and cultures today. SVE 1/50/-2. Labor precautions reviewed. Follow up 2 wks.   Doreene Burke, CNM

## 2019-01-29 NOTE — Addendum Note (Signed)
Addended by: Brooke Dare on: 01/29/2019 12:05 PM   Modules accepted: Orders

## 2019-01-29 NOTE — Patient Instructions (Signed)
Group B Streptococcus Infection During Pregnancy  Group B Streptococcus (GBS) is a type of bacteria (Streptococcus agalactiae) that is often found in healthy people, commonly in the rectum, vagina, and intestines. In people who are healthy and not pregnant, the bacteria rarely cause serious illness or complications. However, women who test positive for GBS during pregnancy can pass the bacteria to their baby during childbirth, which can cause serious infection in the baby after birth. Women with GBS may also have infections during their pregnancy or immediately after childbirth, such as such as urinary tract infections (UTIs) or infections of the uterus (uterine infections). Having GBS also increases a woman's risk of complications during pregnancy, such as early (preterm) labor or delivery, miscarriage, or stillbirth. Routine testing (screening) for GBS is recommended for all pregnant women. What increases the risk? You may have a higher risk for GBS infection during pregnancy if you had one during a past pregnancy. What are the signs or symptoms? In most cases, GBS infection does not cause symptoms in pregnant women. Signs and symptoms of a possible GBS-related infection may include:  Labor starting before the 37th week of pregnancy.  A UTI or bladder infection, which may cause: ? Fever. ? Pain or burning during urination. ? Frequent urination.  Fever during labor, along with: ? Bad-smelling discharge. ? Uterine tenderness. ? Rapid heartbeat in the mother, baby, or both. Rare but serious symptoms of a possible GBS-related infection in women include:  Blood infection (septicemia). This may cause fever, chills, or confusion.  Lung infection (pneumonia). This may cause fever, chills, cough, rapid breathing, difficulty breathing, or chest pain.  Bone, joint, skin, or soft tissue infection. How is this diagnosed? You may be screened for GBS between week 35 and week 37 of your pregnancy. If  you have symptoms of preterm labor, you may be screened earlier. This condition is diagnosed based on lab test results from:  A swab of fluid from the vagina and rectum.  A urine sample. How is this treated? This condition is treated with antibiotic medicine. When you go into labor, or as soon as your water breaks (your membranes rupture), you will be given antibiotics through an IV tube. Antibiotics will continue until after you give birth. If you are having a cesarean delivery, you do not need antibiotics unless your membranes have already ruptured. Follow these instructions at home:  Take over-the-counter and prescription medicines only as told by your health care provider.  Take your antibiotic medicine as told by your health care provider. Do not stop taking the antibiotic even if you start to feel better.  Keep all pre-birth (prenatal) visits and follow-up visits as told by your health care provider. This is important. Contact a health care provider if:  You have pain or burning when you urinate.  You have to urinate frequently.  You have a fever or chills.  You develop a bad-smelling vaginal discharge. Get help right away if:  Your membranes rupture.  You go into labor.  You have severe pain in your abdomen.  You have difficulty breathing.  You have chest pain. This information is not intended to replace advice given to you by your health care provider. Make sure you discuss any questions you have with your health care provider. Document Released: 12/26/2007 Document Revised: 04/14/2016 Document Reviewed: 04/13/2016 Elsevier Interactive Patient Education  2019 [employer] Inc[employer] of the uterus can occur throughout pregnancy, but they are not always a sign that you are  in labor. You may have practice contractions called Braxton Hicks contractions. These false labor contractions are sometimes confused with true labor. What are Deberah Pelton contractions? Braxton Hicks contractions are tightening movements that occur in the muscles of the uterus before labor. Unlike true labor contractions, these contractions do not result in opening (dilation) and thinning of the cervix. Toward the end of pregnancy (32-34 weeks), Braxton Hicks contractions can happen more often and may become stronger. These contractions are sometimes difficult to tell apart from true labor because they can be very uncomfortable. You should not feel embarrassed if you go to the hospital with false labor. Sometimes, the only way to tell if you are in true labor is for your health care provider to look for changes in the cervix. The health care provider will do a physical exam and may monitor your contractions. If you are not in true labor, the exam should show that your cervix is not dilating and your water has not broken. If there are no other health problems associated with your pregnancy, it is completely safe for you to be sent home with false labor. You may continue to have Braxton Hicks contractions until you go into true labor. How to tell the difference between true labor and false labor True labor  Contractions last 30-70 seconds.  Contractions become very regular.  Discomfort is usually felt in the top of the uterus, and it spreads to the lower abdomen and low back.  Contractions do not go away with walking.  Contractions usually become more intense and increase in frequency.  The cervix dilates and gets thinner. False labor  Contractions are usually shorter and not as strong as true labor contractions.  Contractions are usually irregular.  Contractions are often felt in the front of the lower abdomen and in the groin.  Contractions may go away when you walk around or change positions while lying down.  Contractions get weaker and are shorter-lasting as time goes on.  The cervix usually does not dilate or become thin. Follow these  instructions at home:   Take over-the-counter and prescription medicines only as told by your health care provider.  Keep up with your usual exercises and follow other instructions from your health care provider.  Eat and drink lightly if you think you are going into labor.  If Braxton Hicks contractions are making you uncomfortable: ? Change your position from lying down or resting to walking, or change from walking to resting. ? Sit and rest in a tub of warm water. ? Drink enough fluid to keep your urine pale yellow. Dehydration may cause these contractions. ? Do slow and deep breathing several times an hour.  Keep all follow-up prenatal visits as told by your health care provider. This is important. Contact a health care provider if:  You have a fever.  You have continuous pain in your abdomen. Get help right away if:  Your contractions become stronger, more regular, and closer together.  You have fluid leaking or gushing from your vagina.  You pass blood-tinged mucus (bloody show).  You have bleeding from your vagina.  You have low back pain that you never had before.  You feel your baby's head pushing down and causing pelvic pressure.  Your baby is not moving inside you as much as it used to. Summary  Contractions that occur before labor are called Braxton Hicks contractions, false labor, or practice contractions.  Braxton Hicks contractions are usually shorter, weaker, farther apart, and  less regular than true labor contractions. True labor contractions usually become progressively stronger and regular, and they become more frequent.  Manage discomfort from Braxton Hicks contractions by changing position, resting in a warm bath, drinking plenty of water, or practicing deep breathing. This information is not intended to replace advice given to you by your health care provider. Make sure you discuss any questions you have with your health care provider. Document  Released: 02/01/2017 Document Revised: 07/03/2017 Document Reviewed: 02/01/2017 Elsevier Interactive Patient Education  2019 Elsevier Inc.  

## 2019-01-29 NOTE — Telephone Encounter (Signed)
Coronavirus (COVID-19) Are you at risk?  Are you at risk for the Coronavirus (COVID-19)?  To be considered HIGH RISK for Coronavirus (COVID-19), you have to meet the following criteria:  . Traveled to [place], [place], [place], [place] or [place]; or in the [place] to [place], [place], [place], or [place]; and have fever, cough, and shortness of breath within the last 2 weeks of travel OR . Been in close contact with a person diagnosed with COVID-19 within the last 2 weeks and have fever, cough, and shortness of breath . IF YOU DO NOT MEET THESE CRITERIA, YOU ARE CONSIDERED LOW RISK FOR COVID-19.  What to do if you are HIGH RISK for COVID-19?  . If you are having a medical emergency, call 911. . Seek medical care right away. Before you go to a doctor's office, urgent care or emergency department, call ahead and tell them about your recent travel, contact with someone diagnosed with COVID-19, and your symptoms. You should receive instructions from your physician's office regarding next steps of care.  . When you arrive at healthcare provider, tell the healthcare staff immediately you have returned from visiting [place], [place], [place], [place] or [place]; or traveled in the [place] to [place], [place], [place], or [place]; in the last two weeks or you have been in close contact with a person diagnosed with COVID-19 in the last 2 weeks.   . Tell the health care staff about your symptoms: fever, cough and shortness of breath. . After you have been seen by a medical provider, you will be either: o Tested for (COVID-19) and discharged home on quarantine except to seek medical care if symptoms worsen, and asked to  - Stay home and avoid contact with others until you get your results (4-5 days)  - Avoid travel on public transportation if possible (such as bus, train, or airplane) or o Sent to the Emergency Department by EMS for evaluation, COVID-19 testing, and possible  admission depending on your condition and test results.  What to do if you are LOW RISK for COVID-19?  Reduce your risk of any infection by using the same precautions used for avoiding the common cold or flu:  . Wash your hands often with soap and warm water for at least 20 seconds.  If soap and water are not readily available, use an alcohol-based hand sanitizer with at least 60% alcohol.  . If coughing or sneezing, cover your mouth and nose by coughing or sneezing into the elbow areas of your shirt or coat, into a tissue or into your sleeve (not your hands). . Avoid shaking hands with others and consider head nods or verbal greetings only. . Avoid touching your eyes, nose, or mouth with unwashed hands.  . Avoid close contact with people who are Rebeckah Masih. . Avoid places or events with large numbers of people in one location, like concerts or sporting events. . Carefully consider travel plans you have or are making. . If you are planning any travel outside or inside the [place], visit the CDC's Travelers' Health webpage for the latest health notices. . If you have some symptoms but not all symptoms, continue to monitor at home and seek medical attention if your symptoms worsen. . If you are having a medical emergency, call 911.  01/29/19 SCREENING NEG SLS ADDITIONAL HEALTHCARE OPTIONS FOR PATIENTS  [facility] / e-Visit: [email or web address]         [facility]: [phone]    [facility]: [phone]                   [facility]: [phone]  

## 2019-01-31 LAB — STREP GP B NAA: Strep Gp B NAA: NEGATIVE

## 2019-02-01 LAB — GC/CHLAMYDIA PROBE AMP
Chlamydia trachomatis, NAA: NEGATIVE
Neisseria Gonorrhoeae by PCR: NEGATIVE

## 2019-02-11 ENCOUNTER — Telehealth: Payer: Self-pay

## 2019-02-11 NOTE — Telephone Encounter (Signed)
Coronavirus (COVID-19) Are you at risk?  Are you at risk for the Coronavirus (COVID-19)?  To be considered HIGH RISK for Coronavirus (COVID-19), you have to meet the following criteria:  . Traveled to [place], [place], [place], [place] or [place]; or in the [place] to [place], [place], [place], or [place]; and have fever, cough, and shortness of breath within the last 2 weeks of travel OR . Been in close contact with a person diagnosed with COVID-19 within the last 2 weeks and have fever, cough, and shortness of breath . IF YOU DO NOT MEET THESE CRITERIA, YOU ARE CONSIDERED LOW RISK FOR COVID-19.  What to do if you are HIGH RISK for COVID-19?  . If you are having a medical emergency, call 911. . Seek medical care right away. Before you go to a doctor's office, urgent care or emergency department, call ahead and tell them about your recent travel, contact with someone diagnosed with COVID-19, and your symptoms. You should receive instructions from your physician's office regarding next steps of care.  . When you arrive at healthcare provider, tell the healthcare staff immediately you have returned from visiting [place], [place], [place], [place] or [place]; or traveled in the [place] to [place], [place], [place], or [place]; in the last two weeks or you have been in close contact with a person diagnosed with COVID-19 in the last 2 weeks.   . Tell the health care staff about your symptoms: fever, cough and shortness of breath. . After you have been seen by a medical provider, you will be either: o Tested for (COVID-19) and discharged home on quarantine except to seek medical care if symptoms worsen, and asked to  - Stay home and avoid contact with others until you get your results (4-5 days)  - Avoid travel on public transportation if possible (such as bus, train, or airplane) or o Sent to the Emergency Department by EMS for evaluation, COVID-19 testing, and possible  admission depending on your condition and test results.  What to do if you are LOW RISK for COVID-19?  Reduce your risk of any infection by using the same precautions used for avoiding the common cold or flu:  . Wash your hands often with soap and warm water for at least 20 seconds.  If soap and water are not readily available, use an alcohol-based hand sanitizer with at least 60% alcohol.  . If coughing or sneezing, cover your mouth and nose by coughing or sneezing into the elbow areas of your shirt or coat, into a tissue or into your sleeve (not your hands). . Avoid shaking hands with others and consider head nods or verbal greetings only. . Avoid touching your eyes, nose, or mouth with unwashed hands.  . Avoid close contact with people who are Tammy Contreras. . Avoid places or events with large numbers of people in one location, like concerts or sporting events. . Carefully consider travel plans you have or are making. . If you are planning any travel outside or inside the [place], visit the CDC's Travelers' Health webpage for the latest health notices. . If you have some symptoms but not all symptoms, continue to monitor at home and seek medical attention if your symptoms worsen. . If you are having a medical emergency, call 911.  02/11/19 SCREENING NEG SLS ADDITIONAL HEALTHCARE OPTIONS FOR PATIENTS  [facility] / e-Visit: [email or web address]         [facility]: [phone]    [facility]: [phone]                   [facility]: [phone]  

## 2019-02-12 ENCOUNTER — Other Ambulatory Visit: Payer: Self-pay

## 2019-02-12 ENCOUNTER — Ambulatory Visit: Payer: BC Managed Care – PPO | Admitting: Obstetrics and Gynecology

## 2019-02-12 VITALS — BP 111/59 | HR 74 | Wt 159.8 lb

## 2019-02-12 DIAGNOSIS — L308 Other specified dermatitis: Secondary | ICD-10-CM

## 2019-02-12 DIAGNOSIS — Z3493 Encounter for supervision of normal pregnancy, unspecified, third trimester: Secondary | ICD-10-CM

## 2019-02-12 LAB — POCT URINALYSIS DIPSTICK OB
Bilirubin, UA: NEGATIVE
Blood, UA: NEGATIVE
Glucose, UA: NEGATIVE
Ketones, UA: NEGATIVE
Nitrite, UA: NEGATIVE
POC,PROTEIN,UA: NEGATIVE
Spec Grav, UA: 1.015 (ref 1.010–1.025)
Urobilinogen, UA: 0.2 E.U./dL
pH, UA: 7 (ref 5.0–8.0)

## 2019-02-12 MED ORDER — TERCONAZOLE 0.8 % VA CREA: 1.0000 | TOPICAL_CREAM | Freq: Every day | VAGINAL | 0 refills | Status: DC

## 2019-02-12 NOTE — Progress Notes (Signed)
ROB- pt is having B nipple pain, cracking, painful, is having some bleeding

## 2019-02-12 NOTE — Progress Notes (Signed)
ROB-discussed nipple irritation and not responding to monistat, had this same problem last pregnancy and while breast feeding. Severe eczema noted worse on left side. All purpose nipple cream prescribed and instructed on use. Also has vaginal yeast infection, terazol 3 prescribed. Labor precautions discussed.

## 2019-02-18 ENCOUNTER — Telehealth: Payer: Self-pay

## 2019-02-18 NOTE — Telephone Encounter (Signed)
Coronavirus (COVID-19) Are you at risk?  Are you at risk for the Coronavirus (COVID-19)?  To be considered HIGH RISK for Coronavirus (COVID-19), you have to meet the following criteria:  . Traveled to [place], [place], [place], [place] or [place]; or in the [place] to [place], [place], [place], or [place]; and have fever, cough, and shortness of breath within the last 2 weeks of travel OR . Been in close contact with a person diagnosed with COVID-19 within the last 2 weeks and have fever, cough, and shortness of breath . IF YOU DO NOT MEET THESE CRITERIA, YOU ARE CONSIDERED LOW RISK FOR COVID-19.  What to do if you are HIGH RISK for COVID-19?  . If you are having a medical emergency, call 911. . Seek medical care right away. Before you go to a doctor's office, urgent care or emergency department, call ahead and tell them about your recent travel, contact with someone diagnosed with COVID-19, and your symptoms. You should receive instructions from your physician's office regarding next steps of care.  . When you arrive at healthcare provider, tell the healthcare staff immediately you have returned from visiting [place], [place], [place], [place] or [place]; or traveled in the [place] to [place], [place], [place], or [place]; in the last two weeks or you have been in close contact with a person diagnosed with COVID-19 in the last 2 weeks.   . Tell the health care staff about your symptoms: fever, cough and shortness of breath. . After you have been seen by a medical provider, you will be either: o Tested for (COVID-19) and discharged home on quarantine except to seek medical care if symptoms worsen, and asked to  - Stay home and avoid contact with others until you get your results (4-5 days)  - Avoid travel on public transportation if possible (such as bus, train, or airplane) or o Sent to the Emergency Department by EMS for evaluation, COVID-19 testing, and possible  admission depending on your condition and test results.  What to do if you are LOW RISK for COVID-19?  Reduce your risk of any infection by using the same precautions used for avoiding the common cold or flu:  . Wash your hands often with soap and warm water for at least 20 seconds.  If soap and water are not readily available, use an alcohol-based hand sanitizer with at least 60% alcohol.  . If coughing or sneezing, cover your mouth and nose by coughing or sneezing into the elbow areas of your shirt or coat, into a tissue or into your sleeve (not your hands). . Avoid shaking hands with others and consider head nods or verbal greetings only. . Avoid touching your eyes, nose, or mouth with unwashed hands.  . Avoid close contact with people who are sick. . Avoid places or events with large numbers of people in one location, like concerts or sporting events. . Carefully consider travel plans you have or are making. . If you are planning any travel outside or inside the [place], visit the CDC's Travelers' Health webpage for the latest health notices. . If you have some symptoms but not all symptoms, continue to monitor at home and seek medical attention if your symptoms worsen. . If you are having a medical emergency, call 911.   ADDITIONAL HEALTHCARE OPTIONS FOR PATIENTS  [facility] / e-Visit: [email or web address]         [facility]: [phone]  [facility]: [phone]                   [facility]: [phone]   Pre-screen negative, DM.   

## 2019-02-19 ENCOUNTER — Encounter: Payer: Self-pay | Admitting: Certified Nurse Midwife

## 2019-02-19 ENCOUNTER — Other Ambulatory Visit: Payer: Self-pay

## 2019-02-19 ENCOUNTER — Telehealth: Payer: Self-pay

## 2019-02-19 ENCOUNTER — Ambulatory Visit: Payer: BC Managed Care – PPO | Admitting: Certified Nurse Midwife

## 2019-02-19 VITALS — BP 103/60 | HR 65 | Wt 161.3 lb

## 2019-02-19 DIAGNOSIS — Z3A40 40 weeks gestation of pregnancy: Secondary | ICD-10-CM

## 2019-02-19 NOTE — Telephone Encounter (Signed)
Coronavirus (COVID-19) Are you at risk?  Are you at risk for the Coronavirus (COVID-19)?  To be considered HIGH RISK for Coronavirus (COVID-19), you have to meet the following criteria:  . Traveled to [place], [place], [place], [place] or [place]; or in the [place] to [place], [place], [place], or [place]; and have fever, cough, and shortness of breath within the last 2 weeks of travel OR . Been in close contact with a person diagnosed with COVID-19 within the last 2 weeks and have fever, cough, and shortness of breath . IF YOU DO NOT MEET THESE CRITERIA, YOU ARE CONSIDERED LOW RISK FOR COVID-19.  What to do if you are HIGH RISK for COVID-19?  . If you are having a medical emergency, call 911. . Seek medical care right away. Before you go to a doctor's office, urgent care or emergency department, call ahead and tell them about your recent travel, contact with someone diagnosed with COVID-19, and your symptoms. You should receive instructions from your physician's office regarding next steps of care.  . When you arrive at healthcare provider, tell the healthcare staff immediately you have returned from visiting [place], [place], [place], [place] or [place]; or traveled in the [place] to [place], [place], [place], or [place]; in the last two weeks or you have been in close contact with a person diagnosed with COVID-19 in the last 2 weeks.   . Tell the health care staff about your symptoms: fever, cough and shortness of breath. . After you have been seen by a medical provider, you will be either: o Tested for (COVID-19) and discharged home on quarantine except to seek medical care if symptoms worsen, and asked to  - Stay home and avoid contact with others until you get your results (4-5 days)  - Avoid travel on public transportation if possible (such as bus, train, or airplane) or o Sent to the Emergency Department by EMS for evaluation, COVID-19 testing, and possible  admission depending on your condition and test results.  What to do if you are LOW RISK for COVID-19?  Reduce your risk of any infection by using the same precautions used for avoiding the common cold or flu:  . Wash your hands often with soap and warm water for at least 20 seconds.  If soap and water are not readily available, use an alcohol-based hand sanitizer with at least 60% alcohol.  . If coughing or sneezing, cover your mouth and nose by coughing or sneezing into the elbow areas of your shirt or coat, into a tissue or into your sleeve (not your hands). . Avoid shaking hands with others and consider head nods or verbal greetings only. . Avoid touching your eyes, nose, or mouth with unwashed hands.  . Avoid close contact with people who are sick. . Avoid places or events with large numbers of people in one location, like concerts or sporting events. . Carefully consider travel plans you have or are making. . If you are planning any travel outside or inside the [place], visit the CDC's Travelers' Health webpage for the latest health notices. . If you have some symptoms but not all symptoms, continue to monitor at home and seek medical attention if your symptoms worsen. . If you are having a medical emergency, call 911.   ADDITIONAL HEALTHCARE OPTIONS FOR PATIENTS  [facility] / e-Visit: [email or web address]         [facility]: [phone]  [facility]: [phone]                   [facility]: [phone]   Prescreened. Neg .cm 

## 2019-02-19 NOTE — Patient Instructions (Signed)
Braxton Hicks Contractions Contractions of the uterus can occur throughout pregnancy, but they are not always a sign that you are in labor. You may have practice contractions called Braxton Hicks contractions. These false labor contractions are sometimes confused with true labor. What are Braxton Hicks contractions? Braxton Hicks contractions are tightening movements that occur in the muscles of the uterus before labor. Unlike true labor contractions, these contractions do not result in opening (dilation) and thinning of the cervix. Toward the end of pregnancy (32-34 weeks), Braxton Hicks contractions can happen more often and may become stronger. These contractions are sometimes difficult to tell apart from true labor because they can be very uncomfortable. You should not feel embarrassed if you go to the hospital with false labor. Sometimes, the only way to tell if you are in true labor is for your health care provider to look for changes in the cervix. The health care provider will do a physical exam and may monitor your contractions. If you are not in true labor, the exam should show that your cervix is not dilating and your water has not broken. If there are no other health problems associated with your pregnancy, it is completely safe for you to be sent home with false labor. You may continue to have Braxton Hicks contractions until you go into true labor. How to tell the difference between true labor and false labor True labor  Contractions last 30-70 seconds.  Contractions become very regular.  Discomfort is usually felt in the top of the uterus, and it spreads to the lower abdomen and low back.  Contractions do not go away with walking.  Contractions usually become more intense and increase in frequency.  The cervix dilates and gets thinner. False labor  Contractions are usually shorter and not as strong as true labor contractions.  Contractions are usually irregular.  Contractions  are often felt in the front of the lower abdomen and in the groin.  Contractions may go away when you walk around or change positions while lying down.  Contractions get weaker and are shorter-lasting as time goes on.  The cervix usually does not dilate or become thin. Follow these instructions at home:   Take over-the-counter and prescription medicines only as told by your health care provider.  Keep up with your usual exercises and follow other instructions from your health care provider.  Eat and drink lightly if you think you are going into labor.  If Braxton Hicks contractions are making you uncomfortable: ? Change your position from lying down or resting to walking, or change from walking to resting. ? Sit and rest in a tub of warm water. ? Drink enough fluid to keep your urine pale yellow. Dehydration may cause these contractions. ? Do slow and deep breathing several times an hour.  Keep all follow-up prenatal visits as told by your health care provider. This is important. Contact a health care provider if:  You have a fever.  You have continuous pain in your abdomen. Get help right away if:  Your contractions become stronger, more regular, and closer together.  You have fluid leaking or gushing from your vagina.  You pass blood-tinged mucus (bloody show).  You have bleeding from your vagina.  You have low back pain that you never had before.  You feel your baby's head pushing down and causing pelvic pressure.  Your baby is not moving inside you as much as it used to. Summary  Contractions that occur before labor are   called Braxton Hicks contractions, false labor, or practice contractions.  Braxton Hicks contractions are usually shorter, weaker, farther apart, and less regular than true labor contractions. True labor contractions usually become progressively stronger and regular, and they become more frequent.  Manage discomfort from Braxton Hicks contractions  by changing position, resting in a warm bath, drinking plenty of water, or practicing deep breathing. This information is not intended to replace advice given to you by your health care provider. Make sure you discuss any questions you have with your health care provider. Document Released: 02/01/2017 Document Revised: 07/03/2017 Document Reviewed: 02/01/2017 Elsevier Interactive Patient Education  2019 [employer] Inc.  

## 2019-02-19 NOTE — Progress Notes (Signed)
ROB doing well. Nipple pain has resolved with cream . Discussed remainder of PNC. BPP for postdate. U/s here on T, W,TH. PT to return tomorrow for BPP. discussed NST and scheduling induction by 41 wks . PT verbalizes and agrees to plan of care. SVE 2/70/-2 . Labor precautions reviewed.    Doreene Burke, CNM

## 2019-02-20 ENCOUNTER — Ambulatory Visit: Payer: BC Managed Care – PPO | Admitting: Certified Nurse Midwife

## 2019-02-20 ENCOUNTER — Inpatient Hospital Stay: Admit: 2019-02-20 | Payer: BC Managed Care – PPO

## 2019-02-20 ENCOUNTER — Ambulatory Visit (INDEPENDENT_AMBULATORY_CARE_PROVIDER_SITE_OTHER): Payer: BC Managed Care – PPO

## 2019-02-20 ENCOUNTER — Other Ambulatory Visit: Payer: Self-pay | Admitting: Obstetrics and Gynecology

## 2019-02-20 VITALS — BP 101/65 | HR 69 | Wt 163.7 lb

## 2019-02-20 DIAGNOSIS — Z3493 Encounter for supervision of normal pregnancy, unspecified, third trimester: Secondary | ICD-10-CM

## 2019-02-20 DIAGNOSIS — Z3A40 40 weeks gestation of pregnancy: Secondary | ICD-10-CM

## 2019-02-20 DIAGNOSIS — O480 Post-term pregnancy: Secondary | ICD-10-CM

## 2019-02-20 LAB — POCT URINALYSIS DIPSTICK OB
Bilirubin, UA: NEGATIVE
Glucose, UA: NEGATIVE
Ketones, UA: NEGATIVE
Nitrite, UA: NEGATIVE
Spec Grav, UA: 1.015 (ref 1.010–1.025)
Urobilinogen, UA: 0.2 E.U./dL
pH, UA: 7 (ref 5.0–8.0)

## 2019-02-20 NOTE — Progress Notes (Signed)
ROB, c/o small amount of vaginal bleeding after cervical check yesterday.

## 2019-02-20 NOTE — Progress Notes (Signed)
ROB-Ready to have baby. BPP today 8/8, findings reviewed with patient. Discussed herbal prep, Miles circuit and spinning babies positioning. Education regarding postdates care. IOL scheduled for 02/28/2019 at 0500. Reviewed red flag symptoms and when to call. RTC x Tuesday for NST or sooner if needed.   ULTRASOUND REPORT  Location: [facility] Date of Service: 02/20/2019   Indications: Biophysical Profile performed for indication of post dates.  Findings:  Singleton intrauterine pregnancy is visualized with FHR at 130 BPM.    Fetal presentation is Cephalic.  Placenta: anterior. Grade: 2 AFI: 10.4 cm  BPP Scoring: Movement: 2/2  Tone: 2/2  Breathing: 2/2  AFI: 2/2  Impression: 1. [redacted]w[redacted]d Viable Singleton Intrauterine pregnancy dated by previously established criteria. 2. AFI is 10.4 cm.  3. BPP is 8/8  Recommendations: 1.Clinical correlation with the patient's History and Physical Exam.

## 2019-02-20 NOTE — Patient Instructions (Signed)
Fetal Movement Counts Patient Name: ________________________________________________ Patient Due Date: ____________________ What is a fetal movement count?  A fetal movement count is the number of times that you feel your baby move during a certain amount of time. This may also be called a fetal kick count. A fetal movement count is recommended for every pregnant woman. You may be asked to start counting fetal movements as early as week 28 of your pregnancy. Pay attention to when your baby is most active. You may notice your baby's sleep and wake cycles. You may also notice things that make your baby move more. You should do a fetal movement count:  When your baby is normally most active.  At the same time each day. A good time to count movements is while you are resting, after having something to eat and drink. How do I count fetal movements? 1. Find a quiet, comfortable area. Sit, or lie down on your side. 2. Write down the date, the start time and stop time, and the number of movements that you felt between those two times. Take this information with you to your health care visits. 3. For 2 hours, count kicks, flutters, swishes, rolls, and jabs. You should feel at least 10 movements during 2 hours. 4. You may stop counting after you have felt 10 movements. 5. If you do not feel 10 movements in 2 hours, have something to eat and drink. Then, keep resting and counting for 1 hour. If you feel at least 4 movements during that hour, you may stop counting. Contact a health care provider if:  You feel fewer than 4 movements in 2 hours.  Your baby is not moving like he or she usually does. Date: ____________ Start time: ____________ Stop time: ____________ Movements: ____________ Date: ____________ Start time: ____________ Stop time: ____________ Movements: ____________ Date: ____________ Start time: ____________ Stop time: ____________ Movements: ____________ Date: ____________ Start time:  ____________ Stop time: ____________ Movements: ____________ Date: ____________ Start time: ____________ Stop time: ____________ Movements: ____________ Date: ____________ Start time: ____________ Stop time: ____________ Movements: ____________ Date: ____________ Start time: ____________ Stop time: ____________ Movements: ____________ Date: ____________ Start time: ____________ Stop time: ____________ Movements: ____________ Date: ____________ Start time: ____________ Stop time: ____________ Movements: ____________ This information is not intended to replace advice given to you by your health care provider. Make sure you discuss any questions you have with your health care provider. Document Released: 10/18/2006 Document Revised: 05/17/2016 Document Reviewed: 10/28/2015 Elsevier Interactive Patient Education  2019 [employer]. Nonstress Test A nonstress test is a procedure that is done during pregnancy in order to check the baby's heartbeat. The procedure can help show if the baby (fetus) is healthy. It is commonly done when:  The baby is past his or her due date.  The pregnancy is high risk.  The baby is moving less than normal.  The mother has lost a pregnancy in the past.  The health care provider suspects a problem with the baby's growth.  There is too much or too little amniotic fluid. The procedure is often done in the third trimester of pregnancy to find out if an early delivery is needed and whether such a delivery is safe. During a nonstress test, the baby's heartbeat is monitored when the baby is resting and when the baby is moving. If the baby is healthy, the heart rate will increase when he or she moves or kicks and will return to normal when he or she rests. Tell a health  care provider about:  Any allergies you have.  Any medical conditions you have.  All medicines you are taking, including vitamins, herbs, eye drops, creams, and over-the-counter medicines. What are the  risks? There are no risks to you or your baby from a nonstress test. This procedure should not be painful or uncomfortable. What happens before the procedure?  Eat a meal right before the test or as directed by your health care provider. Food may help encourage the baby to move.  Use the restroom right before the test. What happens during the procedure?  Two monitors will be placed on your abdomen. One will record the baby's heart rate and the other will record the contractions of your uterus.  You may be asked to lie down on your side or to sit upright.  You may be given a button to press when you feel your baby move.  Your health care provider will listen to your baby's heartbeat and recorded it. He or she may also watch your baby's heartbeat on a screen.  If the baby seems to be sleeping, you may be asked to drink some juice or soda, eat a snack, or change positions. The procedure may vary among health care providers and hospitals. What happens after the procedure?  Your health care provider will discuss the test results with you and make recommendations for the future. Depending on the results, your health care provider may order additional tests or another course of action.  If your health care provider gave you any diet or activity instructions, make sure to follow them.  Keep all follow-up visits as told by your health care provider. This is important. Summary  A nonstress test is a procedure that is done during pregnancy in order to check the baby's heartbeat. The procedure can help show if the baby is healthy.  The procedure is often done in the third trimester of pregnancy to find out if an early delivery is needed and whether such a delivery is safe.  During a nonstress test, the baby's heartbeat is monitored when the baby is resting and when the baby is moving. If the baby is healthy, the heart rate will increase when he or she moves or kicks and will return to normal when  he or she rests.  Your health care provider will discuss the test results with you and make recommendations for the future. This information is not intended to replace advice given to you by your health care provider. Make sure you discuss any questions you have with your health care provider. Document Released: 09/08/2002 Document Revised: 12/28/2016 Document Reviewed: 12/28/2016 Elsevier Interactive Patient Education  2019 [employer].

## 2019-02-21 LAB — FUNGUS CULTURE W SMEAR

## 2019-02-22 ENCOUNTER — Other Ambulatory Visit: Payer: Self-pay

## 2019-02-22 ENCOUNTER — Inpatient Hospital Stay
Admission: EM | Admit: 2019-02-22 | Discharge: 2019-02-23 | DRG: 806 | Disposition: A | Discharge status: Home / Self Care | Payer: BC Managed Care – PPO | Attending: Certified Nurse Midwife | Admitting: Certified Nurse Midwife

## 2019-02-22 DIAGNOSIS — D62 Acute posthemorrhagic anemia: Secondary | ICD-10-CM | POA: Diagnosis not present

## 2019-02-22 DIAGNOSIS — Z1159 Encounter for screening for other viral diseases: Secondary | ICD-10-CM | POA: Diagnosis not present

## 2019-02-22 DIAGNOSIS — O26893 Other specified pregnancy related conditions, third trimester: Secondary | ICD-10-CM | POA: Diagnosis present

## 2019-02-22 DIAGNOSIS — Z8709 Personal history of other diseases of the respiratory system: Secondary | ICD-10-CM

## 2019-02-22 DIAGNOSIS — Z3A40 40 weeks gestation of pregnancy: Secondary | ICD-10-CM | POA: Diagnosis not present

## 2019-02-22 DIAGNOSIS — Z3480 Encounter for supervision of other normal pregnancy, unspecified trimester: Secondary | ICD-10-CM

## 2019-02-22 DIAGNOSIS — Z862 Personal history of diseases of the blood and blood-forming organs and certain disorders involving the immune mechanism: Secondary | ICD-10-CM

## 2019-02-22 DIAGNOSIS — Z3490 Encounter for supervision of normal pregnancy, unspecified, unspecified trimester: Secondary | ICD-10-CM | POA: Diagnosis present

## 2019-02-22 DIAGNOSIS — O9081 Anemia of the puerperium: Principal | ICD-10-CM | POA: Diagnosis not present

## 2019-02-22 DIAGNOSIS — O480 Post-term pregnancy: Secondary | ICD-10-CM | POA: Diagnosis not present

## 2019-02-22 LAB — SARS CORONAVIRUS 2 BY RT PCR (HOSPITAL ORDER, PERFORMED IN ~~LOC~~ HOSPITAL LAB): SARS Coronavirus 2: NEGATIVE

## 2019-02-22 LAB — CBC
HCT: 36 % (ref 36.0–46.0)
Hemoglobin: 12 g/dL (ref 12.0–15.0)
MCH: 32.8 pg (ref 26.0–34.0)
MCHC: 33.3 g/dL (ref 30.0–36.0)
MCV: 98.4 fL (ref 80.0–100.0)
Platelets: 170 10*3/uL (ref 150–400)
RBC: 3.66 MIL/uL — ABNORMAL LOW (ref 3.87–5.11)
RDW: 12.6 % (ref 11.5–15.5)
WBC: 11 10*3/uL — ABNORMAL HIGH (ref 4.0–10.5)
nRBC: 0 % (ref 0.0–0.2)

## 2019-02-22 LAB — TYPE AND SCREEN
ABO/RH(D): O POS
Antibody Screen: NEGATIVE

## 2019-02-22 MED ORDER — LACTATED RINGERS IV SOLN
INTRAVENOUS | Status: DC
  Administered 2019-02-22: 10:00:00 via INTRAVENOUS

## 2019-02-22 MED ORDER — COCONUT OIL OIL
1.0000 "application " | TOPICAL_OIL | Status: DC | PRN
  Filled 2019-02-22: qty 120

## 2019-02-22 MED ORDER — PRENATAL MULTIVITAMIN CH
1.0000 | ORAL_TABLET | Freq: Every day | ORAL | Status: DC
  Administered 2019-02-23: 1 via ORAL
  Filled 2019-02-22: qty 1

## 2019-02-22 MED ORDER — SODIUM CHLORIDE 0.9% FLUSH: 3.0000 mL | Freq: Two times a day (BID) | INTRAVENOUS | Status: DC

## 2019-02-22 MED ORDER — ACETAMINOPHEN 325 MG PO TABS: 650.0000 mg | ORAL_TABLET | ORAL | Status: DC | PRN

## 2019-02-22 MED ORDER — LACTATED RINGERS IV SOLN: 500.0000 mL | INTRAVENOUS | Status: DC | PRN

## 2019-02-22 MED ORDER — TERBUTALINE SULFATE 1 MG/ML IJ SOLN: 0.2500 mg | Freq: Once | INTRAMUSCULAR | Status: DC | PRN

## 2019-02-22 MED ORDER — METHYLERGONOVINE MALEATE 0.2 MG PO TABS: 0.2000 mg | ORAL_TABLET | ORAL | Status: DC | PRN

## 2019-02-22 MED ORDER — OXYCODONE-ACETAMINOPHEN 5-325 MG PO TABS: 2.0000 | ORAL_TABLET | ORAL | Status: DC | PRN

## 2019-02-22 MED ORDER — ONDANSETRON HCL 4 MG/2ML IJ SOLN: 4.0000 mg | INTRAMUSCULAR | Status: DC | PRN

## 2019-02-22 MED ORDER — OXYCODONE-ACETAMINOPHEN 5-325 MG PO TABS: 1.0000 | ORAL_TABLET | ORAL | Status: DC | PRN

## 2019-02-22 MED ORDER — DIPHENHYDRAMINE HCL 25 MG PO CAPS: 25.0000 mg | ORAL_CAPSULE | Freq: Four times a day (QID) | ORAL | Status: DC | PRN

## 2019-02-22 MED ORDER — MISOPROSTOL 200 MCG PO TABS
ORAL_TABLET | ORAL | Status: AC
  Filled 2019-02-22: qty 4

## 2019-02-22 MED ORDER — BENZOCAINE-MENTHOL 20-0.5 % EX AERO: 1.0000 "application " | INHALATION_SPRAY | CUTANEOUS | Status: DC | PRN

## 2019-02-22 MED ORDER — ONDANSETRON HCL 4 MG PO TABS: 4.0000 mg | ORAL_TABLET | ORAL | Status: DC | PRN

## 2019-02-22 MED ORDER — LIDOCAINE HCL (PF) 1 % IJ SOLN
30.0000 mL | INTRAMUSCULAR | Status: DC | PRN
  Filled 2019-02-22: qty 30

## 2019-02-22 MED ORDER — OXYTOCIN BOLUS FROM INFUSION
500.0000 mL | Freq: Once | INTRAVENOUS | Status: AC
  Administered 2019-02-22: 500 mL via INTRAVENOUS

## 2019-02-22 MED ORDER — DIBUCAINE (PERIANAL) 1 % EX OINT: 1.0000 "application " | TOPICAL_OINTMENT | CUTANEOUS | Status: DC | PRN

## 2019-02-22 MED ORDER — OXYTOCIN 40 UNITS IN NORMAL SALINE INFUSION - SIMPLE MED
2.5000 [IU]/h | INTRAVENOUS | Status: DC
  Filled 2019-02-22: qty 1000

## 2019-02-22 MED ORDER — IBUPROFEN 600 MG PO TABS
600.0000 mg | ORAL_TABLET | Freq: Four times a day (QID) | ORAL | Status: DC
  Administered 2019-02-22 – 2019-02-23 (×2): 600 mg via ORAL
  Filled 2019-02-22 (×5): qty 1

## 2019-02-22 MED ORDER — MISOPROSTOL 50MCG HALF TABLET: 50.0000 ug | ORAL_TABLET | ORAL | Status: DC

## 2019-02-22 MED ORDER — OXYTOCIN 40 UNITS IN NORMAL SALINE INFUSION - SIMPLE MED
1.0000 m[IU]/min | INTRAVENOUS | Status: DC
  Administered 2019-02-22: 2 m[IU]/min via INTRAVENOUS

## 2019-02-22 MED ORDER — SODIUM CHLORIDE 0.9 % IV SOLN: 250.0000 mL | INTRAVENOUS | Status: DC | PRN

## 2019-02-22 MED ORDER — WITCH HAZEL-GLYCERIN EX PADS: 1.0000 "application " | MEDICATED_PAD | CUTANEOUS | Status: DC | PRN

## 2019-02-22 MED ORDER — SODIUM CHLORIDE 0.9% FLUSH: 3.0000 mL | INTRAVENOUS | Status: DC | PRN

## 2019-02-22 MED ORDER — SENNOSIDES-DOCUSATE SODIUM 8.6-50 MG PO TABS
2.0000 | ORAL_TABLET | ORAL | Status: DC
  Administered 2019-02-22: 2 via ORAL
  Filled 2019-02-22: qty 2

## 2019-02-22 MED ORDER — BUTORPHANOL TARTRATE 2 MG/ML IJ SOLN
1.0000 mg | INTRAMUSCULAR | Status: DC | PRN
  Administered 2019-02-22: 1 mg via INTRAVENOUS
  Filled 2019-02-22: qty 1

## 2019-02-22 MED ORDER — SOD CITRATE-CITRIC ACID 500-334 MG/5ML PO SOLN: 30.0000 mL | ORAL | Status: DC | PRN

## 2019-02-22 MED ORDER — ONDANSETRON HCL 4 MG/2ML IJ SOLN: 4.0000 mg | Freq: Four times a day (QID) | INTRAMUSCULAR | Status: DC | PRN

## 2019-02-22 MED ORDER — METHYLERGONOVINE MALEATE 0.2 MG/ML IJ SOLN: 0.2000 mg | INTRAMUSCULAR | Status: DC | PRN

## 2019-02-22 MED ORDER — SIMETHICONE 80 MG PO CHEW: 80.0000 mg | CHEWABLE_TABLET | ORAL | Status: DC | PRN

## 2019-02-22 MED ORDER — ZOLPIDEM TARTRATE 5 MG PO TABS: 5.0000 mg | ORAL_TABLET | Freq: Every evening | ORAL | Status: DC | PRN

## 2019-02-22 NOTE — Lactation Note (Signed)
This note was copied from a baby's chart. Lactation Consultation Note  Patient Name: Tammy Contreras VUDTH'Y Date: 02/22/2019 Reason for consult: Follow-up assessment;Term Assisted mom with pillow support to get in comfortable position in sidelying position initially.  In sidelying position, Camilla kept coming on and off the breast crying vigorously and mom struggled to see to get her to latch deeply.  Demonstrated hand expression.  Mom has large, slightly flat nipples that evert with compression.  Repositioned her to the football hold.  Even though she is large baby, both mom and baby were more comfortable in the football hold.  Demonstrated how to sandwich breast  It took several attempts to get her to sustain the latch.  Once she latched deeply enough, she began strong, rhythmic sucking with frequent swallowing.  Reviewed supply and demand, normal course of lactation and routine newborn feeding patterns.  Lactation name and number written on white board and encouraged to call with any questions, concerns or assistance.    Maternal Data Formula Feeding for Exclusion: No Has patient been taught Hand Expression?: Yes Does the patient have breastfeeding experience prior to this delivery?: Yes  Feeding Feeding Type: Breast Fed  LATCH Score Latch: Repeated attempts needed to sustain latch, nipple held in mouth throughout feeding, stimulation needed to elicit sucking reflex.  Audible Swallowing: A few with stimulation  Type of Nipple: Everted at rest and after stimulation(Large nipples)  Comfort (Breast/Nipple): Filling, red/small blisters or bruises, mild/mod discomfort  Hold (Positioning): Assistance needed to correctly position infant at breast and maintain latch.  LATCH Score: 6  Interventions Interventions: Breast feeding basics reviewed;Assisted with latch;Skin to skin;Breast massage;Hand express;Reverse pressure;Breast compression;Adjust position;Support pillows;Position  options;Coconut oil  Lactation Tools Discussed/Used Tools: Coconut oil WIC Program: [employer])   Consult Status Consult Status: Follow-up Follow-up type: Call as needed    Louis Meckel 02/22/2019, 8:50 PM

## 2019-02-22 NOTE — Progress Notes (Signed)
Patient ID: Tammy Contreras, female   DOB: 30-Oct-1995, 23 y.o.   MRN: [id number]  Tammy Contreras is a 23 y.o. G2P1001 at [redacted]w[redacted]d by LMP, verified by first trimester ultrasound, admitted for early labor  Subjective:  Reports relief of pain and ability to sleep since dose of IV medication. Notes decreased frequency and intensity of contractions. Agrees to pitocin augmentation at this time.   Denies difficulty breathing or respiratory distress, chest pain, vaginal bleeding, dysuria, and leg pain or swelling.   FOB at bedside for continuous labor support.   Objective:  Temp:  [97.8 F (36.6 C)-98.4 F (36.9 C)] 97.8 F (36.6 C) (05/23 1357) Pulse Rate:  [69-82] 71 (05/23 1359) Resp:  [16-19] 19 (05/23 1010) BP: (106-122)/(68-84) 106/68 (05/23 1359) Weight:  [73.9 kg] 73.9 kg (05/23 0917)  Fetal Wellbeing:  Category I  UC:   regular, every two (2) to seven (7) minutes; soft resting tone  SVE:   Dilation: 7 Effacement (%): 70 Station: -1 Exam by:: Rakiya Krawczyk, CNM  Labs: Lab Results  Component Value Date   WBC 11.0 (H) 02/22/2019   HGB 12.0 02/22/2019   HCT 36.0 02/22/2019   MCV 98.4 02/22/2019   PLT 170 02/22/2019    Assessment:  Tammy Canuto Tzintzunis a 23 y.o.G2P1001 at [redacted]w[redacted]d admitted for labor, Rh positive, GBS negative, History of anemia, History asthma  FHR Category I  Plan:  Will start pitocin, see orders.   Reviewed red flag symptoms and when to call.   Continue orders as written. Reassess as needed.    Gunnar Bulla, CNM [facility], [facility] 02/22/2019, 2:37 PM

## 2019-02-22 NOTE — OB Triage Note (Addendum)
Pt is a 23 y.o. G2P1 seen at 40wk 2d w/ c/o of ctx. Pt states "ctx started around 0200 and have progressively gotten worse." Pt reports the ctx are about 2-3 minutes apart. Pt rates her pain a 5/10. Pt denies vaginal bleeding and LOF. Pt states positive fetal movement. Pt states "she does feel some vaginal pressure." FHT 119. Monitors applied and assessing. Will continue to monitor.

## 2019-02-22 NOTE — H&P (Signed)
Obstetric History and Physical  Tammy Contreras is a 23 y.o. G2P1001 with IUP at [redacted]w[redacted]d presenting with contractions since 0230.   Patient states she has been having  regular, every two (2) to three (3) minutes contractions, none vaginal bleeding, intact membranes, with active fetal movement.    Denies difficulty breathing or respiratory distress, chest pain, vaginal bleeding, dysuria, and leg pain or swelling.   Prenatal Course  Source of Care: [facility]-initial visit at 8 wks, total visits: 12  Pregnancy complications or risks: History of anemia, History of asthma  Prenatal labs and studies:  ABO, Rh: O/Positive/-- 10-Aug-2023 1539)  Antibody: Negative 08-10-23 1539)  Rubella: 1.61 August 10, 2023 1539)  Varicella: 914 2023-08-10 1539)  RPR: Non Reactive (03/02 0908)   HBsAg: Negative 08/10/2023 1539)   HIV: Non Reactive 08-10-2023 1539)   VZD:GLOVFIEP (04/29 1319)  1 hr Glucola: 110 (03/02 0908)  Genetic screening: Low risk female (11/15 1538)  Anatomy US: Complete, normal (01/14 1306)  Past Medical History:  Diagnosis Date  . Anemia   . Asthma     History reviewed. No pertinent surgical history.  OB History  Gravida Para Term Preterm AB Living  2 1 1     1   SAB TAB Ectopic Multiple Live Births        1 1    # Outcome Date GA Lbr Len/2nd Weight Sex Delivery Anes PTL Lv  2A Gravida           2B Current           1 Term 2014    F Vag-Spont       Social History   Socioeconomic History  . Marital status: Married    Spouse name: Not on file  . Number of children: Not on file  . Years of education: Not on file  . Highest education level: Not on file  Occupational History  . Not on file  Social Needs  . Financial resource strain: Not on file  . Food insecurity:    Worry: Not on file    Inability: Not on file  . Transportation needs:    Medical: Not on file    Non-medical: Not on file  Tobacco Use  . Smoking status: Never Smoker  . Smokeless tobacco: Never Used   Substance and Sexual Activity  . Alcohol use: Yes    Comment: occas  . Drug use: Never  . Sexual activity: Yes    Birth control/protection: Other-see comments    Comment: Still deciding  Lifestyle  . Physical activity:    Days per week: Not on file    Minutes per session: Not on file  . Stress: Not on file  Relationships  . Social connections:    Talks on phone: Not on file    Gets together: Not on file    Attends religious service: Not on file    Active member of club or organization: Not on file    Attends meetings of clubs or organizations: Not on file    Relationship status: Not on file  Other Topics Concern  . Not on file  Social History Narrative  . Not on file    Family History  Problem Relation Age of Onset  . Healthy Mother   . Healthy Father     Medications Prior to Admission  Medication Sig Dispense Refill Last Dose  . Prenat w/o [id number] (CITRANATAL ASSURE) 35-1 & 300 MG tablet Take 1 tablet by mouth daily. 30 tablet 11  02/21/2019 at Unknown time    No Known Allergies  Review of Systems: Negative except for what is mentioned in HPI.  Physical Exam:  Temp:  [98.3 F (36.8 C)] 98.3 F (36.8 C) (05/23 0644) Pulse Rate:  [69] 69 (05/23 0644) Resp:  [16] 16 (05/23 0644) BP: (119)/(74) 119/74 (05/23 0644) Weight:  [73.9 kg] 73.9 kg (05/23 0644)  GENERAL: Well-developed, well-nourished female in no acute distress.   LUNGS: Clear to auscultation bilaterally.   HEART: Regular rate and rhythm.  ABDOMEN: Soft, nontender, nondistended, gravid.  EXTREMITIES: Nontender, no edema, 2+ distal pulses.  Cervical Exam: Dilation: 4.5 Effacement (%): 70 Cervical Position: Middle Station: -2 Presentation: Vertex Exam by:: Modene Andy  FHR Category I  Contractions: Every two (2) to five (5) minutes, soft resting tone   Pertinent Labs/Studies:   No results found for this or any previous visit (from the past 24 hour(s)).  Assessment :  Tammy Contreras is a 23 y.o. G2P1001 at 6222w2d being admitted for labor, Rh positive, GBS negative, History of anemia, History asthma  FHR Category I  Plan:  Admit to birthing suites, see orders.   Labor: Expectant management.  Induction/Augmentation as needed, per protocol.  Delivery plan: Hopeful for vaginal delivery.   Dr. Logan BoresEvans notified of admission and plan of care.    Gunnar BullaJenkins Michelle Trevyon Swor, CNM [facility], [facility] 02/22/19 8:43 AM

## 2019-02-22 NOTE — Progress Notes (Signed)
Patient ID: Tammy Contreras, female   DOB: [birth year], 23 y.o.   MRN: [id number]  Tammy Contreras is a 23 y.o. G2P1001 at [redacted]w[redacted]d by LMP, verified by first trimester ultrasound, admitted for early labor  Subjective:  Reports decreased contraction frequency and intensity. Agrees to AROM before use of IV pitocin.   Denies difficulty breathing or respiratory distress, chest, pain, vaginal bleeding, dysuria, and leg pain or swelling.   FOB at bedside for continuous labor support.   Objective:  Temp:  [97.9 F (36.6 C)-98.4 F (36.9 C)] 97.9 F (36.6 C) (05/23 1010) Pulse Rate:  [69-82] 82 (05/23 1010) Resp:  [16-19] 19 (05/23 1010) BP: (112-122)/(74-84) 122/80 (05/23 1010) Weight:  [73.9 kg] 73.9 kg (05/23 0917)  Fetal Wellbeing:  Category I  UC: every one (1) to five (5) minutes, soft resting tone  SVE:   Dilation: 5 Effacement (%): 70 Station: -1 Exam by:: Ollen Rao, CNM  Labs: Lab Results  Component Value Date   WBC 11.0 (H) 02/22/2019   HGB 12.0 02/22/2019   HCT 36.0 02/22/2019   MCV 98.4 02/22/2019   PLT 170 02/22/2019    Assessment:  Tammy Contreras is a 23 y.o. G2P1001 at [redacted]w[redacted]d admitted for labor, Rh positive, GBS negative, History of anemia, History asthma  FHR Category I  Plan:  Encouraged position change and use of peanut ball.   Reviewed red flag symptoms and when to call.   Continue orders as written. Reassess as needed.    Gunnar Bulla, CNM [facility], [facility] 02/22/2019, 11:35 AM

## 2019-02-23 LAB — CBC
HCT: 29.4 % — ABNORMAL LOW (ref 36.0–46.0)
Hemoglobin: 9.8 g/dL — ABNORMAL LOW (ref 12.0–15.0)
MCH: 32.5 pg (ref 26.0–34.0)
MCHC: 33.3 g/dL (ref 30.0–36.0)
MCV: 97.4 fL (ref 80.0–100.0)
Platelets: 150 10*3/uL (ref 150–400)
RBC: 3.02 MIL/uL — ABNORMAL LOW (ref 3.87–5.11)
RDW: 12.8 % (ref 11.5–15.5)
WBC: 9.7 10*3/uL (ref 4.0–10.5)
nRBC: 0 % (ref 0.0–0.2)

## 2019-02-23 MED ORDER — FERROUS SULFATE 325 (65 FE) MG PO TABS
325.0000 mg | ORAL_TABLET | Freq: Three times a day (TID) | ORAL | Status: DC
  Administered 2019-02-23: 325 mg via ORAL
  Filled 2019-02-23: qty 1

## 2019-02-23 MED ORDER — MAGNESIUM OXIDE 400 (241.3 MG) MG PO TABS: 400.0000 mg | ORAL_TABLET | Freq: Every day | ORAL | 5 refills | Status: DC

## 2019-02-23 MED ORDER — MAGNESIUM OXIDE 400 (241.3 MG) MG PO TABS
400.0000 mg | ORAL_TABLET | Freq: Every day | ORAL | Status: DC
  Administered 2019-02-23: 16:00:00 400 mg via ORAL
  Filled 2019-02-23: qty 1

## 2019-02-23 MED ORDER — IBUPROFEN 600 MG PO TABS: 600.0000 mg | ORAL_TABLET | Freq: Four times a day (QID) | ORAL | 0 refills | Status: DC

## 2019-02-23 MED ORDER — FERROUS SULFATE 325 (65 FE) MG PO TABS: 325.0000 mg | ORAL_TABLET | Freq: Three times a day (TID) | ORAL | 3 refills | Status: DC

## 2019-02-23 NOTE — Discharge Summary (Signed)
  Obstetric Discharge Summary  Patient ID: Tammy Contreras MRN: [id number] DOB/AGE: [birth year] 23 y.o.   Date of Admission: 02/22/2019  Date of Discharge:  02/23/19   Admitting Diagnosis: Onset of Labor at [redacted]w[redacted]d  Secondary Diagnosis: History anemia, History of asthma  Mode of Delivery: Normal spontaneous vaginal delivery     Discharge Diagnosis: No other diagnosis   Intrapartum Procedures: Atificial rupture of membranes and pitocin augmentation   Post partum procedures: None  Complications: None   Brief Hospital Course   Tammy Contreras is a F1Q1975 who had a SVD on 02/22/2019;  for further details of this birth, please refer to the delivey summary.  Patient had an uncomplicated postpartum course.  By time of discharge on PPD#1, her pain was controlled on oral pain medications; she had appropriate lochia and was ambulating, voiding without difficulty and tolerating regular diet.  She was deemed stable for discharge to home.    Labs: CBC Latest Ref Rng & Units 02/23/2019 02/22/2019 12/02/2018  WBC 4.0 - 10.5 K/uL 9.7 11.0(H) 6.8  Hemoglobin 12.0 - 15.0 g/dL 8.8(T) 25.4 98.2  Hematocrit 36.0 - 46.0 % 29.4(L) 36.0 33.2(L)  Platelets 150 - 400 K/uL 150 170 186   O POS  Physical exam:   Temp:  [97.8 F (36.6 C)-98.5 F (36.9 C)] 98.4 F (36.9 C) (05/24 0731) Pulse Rate:  [71-91] 76 (05/24 0731) Resp:  [18-20] 18 (05/24 0731) BP: (98-124)/(61-80) 103/67 (05/24 0731) SpO2:  [97 %-99 %] 99 % (05/24 0731)  General: alert and no distress  Lochia: appropriate  Abdomen: soft, NT  Uterine Fundus: firm  Perineum: healing well, no significant drainage, no dehiscence, no significant erythema  Extremities: No evidence of DVT seen on physical exam. No lower extremity edema.  Discharge Instructions: Per After Visit Summary.  Activity: Advance as tolerated. Pelvic rest for 6 weeks.  Also refer to After Visit  Summary  Diet: Regular  Medications: Allergies as  of 02/23/2019   No Known Allergies     Medication List    TAKE these medications   CitraNatal Assure 35-1 & 300 MG tablet Take 1 tablet by mouth daily.   ferrous sulfate 325 (65 FE) MG tablet Take 1 tablet (325 mg total) by mouth 3 (three) times daily with meals.   ibuprofen 600 MG tablet Commonly known as:  ADVIL Take 1 tablet (600 mg total) by mouth every 6 (six) hours.   magnesium oxide 400 (241.3 Mg) MG tablet Commonly known as:  MAG-OX Take 1 tablet (400 mg total) by mouth daily.      Outpatient follow up:  Follow-up Information    Gunnar Bulla, CNM. Call in 6 week(s).   Specialties:  Certified Nurse Midwife, Obstetrics and Gynecology, Radiology Why:  Please call to schedule six (6) week postpartum visit with [facility] Contact information: [address] [place] [address] [phone]          Postpartum contraception: IUD  Discharged Condition: stable  Discharged to: home   Newborn Data:  Disposition:home with mother  Apgars: APGAR (1 MIN): 8   APGAR (5 MINS): 9    Baby Feeding: Breast   Gunnar Bulla, CNM [facility], [facility] 02/23/19 12:35 PM

## 2019-02-23 NOTE — Discharge Summary (Signed)
Discharge instr given to mom and fob. Both verbalized understanding. No needs or questions expressed. ID tags checked and security bracelet removed. Mom and baby discharged via [facility] to [facility] entrance per NT Carver

## 2019-02-23 NOTE — Progress Notes (Signed)
Patient ID: Tammy Contreras, female   DOB: [birth year], 23 y.o.   MRN: [id number]  Post Partum Day # 1, s/p spontaneous vaginal birth, Rh positive, Postpartum anemia due to blood loss  Subjective:  Patient resting in bed with infant and spouse at bedside. No questions or concerns.   Denies difficulty breathing or respiratory distress, chest pain, abdominal pain, excessive vaginal bleeding, dysuria, and leg pain or swelling.   Objective: Temp:  [97.8 F (36.6 C)-98.5 F (36.9 C)] 98.4 F (36.9 C) (05/24 0731) Pulse Rate:  [71-91] 76 (05/24 0731) Resp:  [18-20] 18 (05/24 0731) BP: (98-124)/(61-80) 103/67 (05/24 0731) SpO2:  [97 %-99 %] 99 % (05/24 0731)  Physical Exam:   General: alert and cooperative   Lungs: clear to auscultation bilaterally  Breasts: normal appearance, no masses or tenderness  Heart: normal apical impulse  Abdomen: soft, non-tender; bowel sounds normal; no masses,  no organomegaly  Pelvis: Lochia: appropriate, Uterine fundus: firm  Extremities: DVT Evaluation: No cords or calf tenderness.No significant calf/ankle edema.  Recent Labs    02/22/19 0945 02/23/19 0610  HGB 12.0 9.8*  HCT 36.0 29.4*    Assessment:  23 year old G2P2 Post Partum Day # 1, s/p spontaneous vaginal birth, Rh positive, Postpartum anemia due to blood loss  Breastfeeding  Plan:  Routine postpartum care and education.   Rx Iron and magnesium, see orders.   Reviewed red flag symptoms and when to call.   Continue orders as written. Reassess as needed.   May discharge later today, if desired.    LOS: 1 day   Gunnar Bulla, CNM [facility], [facility] 02/23/2019 12:07 PM

## 2019-02-23 NOTE — Discharge Instructions (Signed)
Levonorgestrel intrauterine device (IUD) °What is this medicine? °LEVONORGESTREL IUD (LEE voe nor jes trel) is a contraceptive (birth control) device. The device is placed inside the uterus by a healthcare professional. It is used to prevent pregnancy. This device can also be used to treat heavy bleeding that occurs during your period. °This medicine may be used for other purposes; ask your health care provider or pharmacist if you have questions. °COMMON BRAND NAME(S): Kyleena, LILETTA, Mirena, Skyla °What should I tell my health care provider before I take this medicine? °They need to know if you have any of these conditions: °-abnormal Pap smear °-cancer of the breast, uterus, or cervix °-diabetes °-endometritis °-genital or pelvic infection now or in the past °-have more than one sexual partner or your partner has more than one partner °-heart disease °-history of an ectopic or tubal pregnancy °-immune system problems °-IUD in place °-liver disease or tumor °-problems with blood clots or take blood-thinners °-seizures °-use intravenous drugs °-uterus of unusual shape °-vaginal bleeding that has not been explained °-an unusual or allergic reaction to levonorgestrel, other hormones, silicone, or polyethylene, medicines, foods, dyes, or preservatives °-pregnant or trying to get pregnant °-breast-feeding °How should I use this medicine? °This device is placed inside the uterus by a health care professional. °Talk to your pediatrician regarding the use of this medicine in children. Special care may be needed. °Overdosage: If you think you have taken too much of this medicine contact a poison control center or emergency room at once. °NOTE: This medicine is only for you. Do not share this medicine with others. °What if I miss a dose? °This does not apply. Depending on the brand of device you have inserted, the device will need to be replaced every 3 to 5 years if you wish to continue using this type of birth  control. °What may interact with this medicine? °Do not take this medicine with any of the following medications: °-amprenavir °-bosentan °-fosamprenavir °This medicine may also interact with the following medications: °-aprepitant °-armodafinil °-barbiturate medicines for inducing sleep or treating seizures °-bexarotene °-boceprevir °-griseofulvin °-medicines to treat seizures like carbamazepine, ethotoin, felbamate, oxcarbazepine, phenytoin, topiramate °-modafinil °-pioglitazone °-rifabutin °-rifampin °-rifapentine °-some medicines to treat HIV infection like atazanavir, efavirenz, indinavir, lopinavir, nelfinavir, tipranavir, ritonavir °-St. John's wort °-warfarin °This list may not describe all possible interactions. Give your health care provider a list of all the medicines, herbs, non-prescription drugs, or dietary supplements you use. Also tell them if you smoke, drink alcohol, or use illegal drugs. Some items may interact with your medicine. °What should I watch for while using this medicine? °Visit your doctor or health care professional for regular check ups. See your doctor if you or your partner has sexual contact with others, becomes HIV positive, or gets a sexual transmitted disease. °This product does not protect you against HIV infection (AIDS) or other sexually transmitted diseases. °You can check the placement of the IUD yourself by reaching up to the top of your vagina with clean fingers to feel the threads. Do not pull on the threads. It is a good habit to check placement after each menstrual period. Call your doctor right away if you feel more of the IUD than just the threads or if you cannot feel the threads at all. °The IUD may come out by itself. You may become pregnant if the device comes out. If you notice that the IUD has come out use a backup birth control method like condoms and call your   health care provider. Using tampons will not change the position of the IUD and are okay to use  during your period. This IUD can be safely scanned with magnetic resonance imaging (MRI) only under specific conditions. Before you have an MRI, tell your healthcare provider that you have an IUD in place, and which type of IUD you have in place. What side effects may I notice from receiving this medicine? Side effects that you should report to your doctor or health care professional as soon as possible: -allergic reactions like skin rash, itching or hives, swelling of the face, lips, or tongue -fever, flu-like symptoms -genital sores -high blood pressure -no menstrual period for 6 weeks during use -pain, swelling, warmth in the leg -pelvic pain or tenderness -severe or sudden headache -signs of pregnancy -stomach cramping -sudden shortness of breath -trouble with balance, talking, or walking -unusual vaginal bleeding, discharge -yellowing of the eyes or skin Side effects that usually do not require medical attention (report to your doctor or health care professional if they continue or are bothersome): -acne -breast pain -change in sex drive or performance -changes in weight -cramping, dizziness, or faintness while the device is being inserted -headache -irregular menstrual bleeding within first 3 to 6 months of use -nausea This list may not describe all possible side effects. Call your doctor for medical advice about side effects. You may report side effects to FDA at 1-800-FDA-1088. Where should I keep my medicine? This does not apply. NOTE: This sheet is a summary. It may not cover all possible information. If you have questions about this medicine, talk to your doctor, pharmacist, or health care provider.  2019 Elsevier/[employer] Standard (2016-06-30 14:14:56) Breastfeeding  Choosing to breastfeed is one of the best decisions you can make for yourself and your baby. A change in hormones during pregnancy causes your breasts to make breast milk in your milk-producing glands. Hormones  prevent breast milk from being released before your baby is born. They also prompt milk flow after birth. Once breastfeeding has begun, thoughts of your baby, as well as his or her sucking or crying, can stimulate the release of milk from your milk-producing glands. Benefits of breastfeeding Research shows that breastfeeding offers many health benefits for infants and mothers. It also offers a cost-free and convenient way to feed your baby. For your baby  Your first milk (colostrum) helps your baby's digestive system to function better.  Special cells in your milk (antibodies) help your baby to fight off infections.  Breastfed babies are less likely to develop asthma, allergies, obesity, or type 2 diabetes. They are also at lower risk for sudden infant death syndrome (SIDS).  Nutrients in breast milk are better able to meet your babys needs compared to infant formula.  Breast milk improves your baby's brain development. For you  Breastfeeding helps to create a very special bond between you and your baby.  Breastfeeding is convenient. Breast milk costs nothing and is always available at the correct temperature.  Breastfeeding helps to burn calories. It helps you to lose the weight that you gained during pregnancy.  Breastfeeding makes your uterus return faster to its size before pregnancy. It also slows bleeding (lochia) after you give birth.  Breastfeeding helps to lower your risk of developing type 2 diabetes, osteoporosis, rheumatoid arthritis, cardiovascular disease, and breast, ovarian, uterine, and endometrial cancer later in life. Breastfeeding basics Starting breastfeeding  Find a comfortable place to sit or lie down, with your neck and back well-supported.  Place a pillow or a rolled-up blanket under your baby to bring him or her to the level of your breast (if you are seated). Nursing pillows are specially designed to help support your arms and your baby while you  breastfeed.  Make sure that your baby's tummy (abdomen) is facing your abdomen.  Gently massage your breast. With your fingertips, massage from the outer edges of your breast inward toward the nipple. This encourages milk flow. If your milk flows slowly, you may need to continue this action during the feeding.  Support your breast with 4 fingers underneath and your thumb above your nipple (make the letter "C" with your hand). Make sure your fingers are well away from your nipple and your babys mouth.  Stroke your baby's lips gently with your finger or nipple.  When your baby's mouth is open wide enough, quickly bring your baby to your breast, placing your entire nipple and as much of the areola as possible into your baby's mouth. The areola is the colored area around your nipple. ? More areola should be visible above your baby's upper lip than below the lower lip. ? Your baby's lips should be opened and extended outward (flanged) to ensure an adequate, comfortable latch. ? Your baby's tongue should be between his or her lower gum and your breast.  Make sure that your baby's mouth is correctly positioned around your nipple (latched). Your baby's lips should create a seal on your breast and be turned out (everted).  It is common for your baby to suck about 2-3 minutes in order to start the flow of breast milk. Latching Teaching your baby how to latch onto your breast properly is very important. An improper latch can cause nipple pain, decreased milk supply, and poor weight gain in your baby. Also, if your baby is not latched onto your nipple properly, he or she may swallow some air during feeding. This can make your baby fussy. Burping your baby when you switch breasts during the feeding can help to get rid of the air. However, teaching your baby to latch on properly is still the best way to prevent fussiness from swallowing air while breastfeeding. Signs that your baby has successfully latched  onto your nipple  Silent tugging or silent sucking, without causing you pain. Infant's lips should be extended outward (flanged).  Swallowing heard between every 3-4 sucks once your milk has started to flow (after your let-down milk reflex occurs).  Muscle movement above and in front of his or her ears while sucking. Signs that your baby has not successfully latched onto your nipple  Sucking sounds or smacking sounds from your baby while breastfeeding.  Nipple pain. If you think your baby has not latched on correctly, slip your finger into the corner of your babys mouth to break the suction and place it between your baby's gums. Attempt to start breastfeeding again. Signs of successful breastfeeding Signs from your baby  Your baby will gradually decrease the number of sucks or will completely stop sucking.  Your baby will fall asleep.  Your baby's body will relax.  Your baby will retain a small amount of milk in his or her mouth.  Your baby will let go of your breast by himself or herself. Signs from you  Breasts that have increased in firmness, weight, and size 1-3 hours after feeding.  Breasts that are softer immediately after breastfeeding.  Increased milk volume, as well as a change in milk consistency and color by  the fifth day of breastfeeding.  Nipples that are not sore, cracked, or bleeding. Signs that your baby is getting enough milk  Wetting at least 1-2 diapers during the first 24 hours after birth.  Wetting at least 5-6 diapers every 24 hours for the first week after birth. The urine should be clear or pale yellow by the age of 5 days.  Wetting 6-8 diapers every 24 hours as your baby continues to grow and develop.  At least 3 stools in a 24-hour period by the age of 5 days. The stool should be soft and yellow.  At least 3 stools in a 24-hour period by the age of 7 days. The stool should be seedy and yellow.  No loss of weight greater than 10% of birth weight  during the first 3 days of life.  Average weight gain of 4-7 oz (113-198 g) per week after the age of 4 days.  Consistent daily weight gain by the age of 5 days, without weight loss after the age of 2 weeks. After a feeding, your baby may spit up a small amount of milk. This is normal. Breastfeeding frequency and duration Frequent feeding will help you make more milk and can prevent sore nipples and extremely full breasts (breast engorgement). Breastfeed when you feel the need to reduce the fullness of your breasts or when your baby shows signs of hunger. This is called "breastfeeding on demand." Signs that your baby is hungry include:  Increased alertness, activity, or restlessness.  Movement of the head from side to side.  Opening of the mouth when the corner of the mouth or cheek is stroked (rooting).  Increased sucking sounds, smacking lips, cooing, sighing, or squeaking.  Hand-to-mouth movements and sucking on fingers or hands.  Fussing or crying. Avoid introducing a pacifier to your baby in the first 4-6 weeks after your baby is born. After this time, you may choose to use a pacifier. Research has shown that pacifier use during the first year of a baby's life decreases the risk of sudden infant death syndrome (SIDS). Allow your baby to feed on each breast as long as he or she wants. When your baby unlatches or falls asleep while feeding from the first breast, offer the second breast. Because newborns are often sleepy in the first few weeks of life, you may need to awaken your baby to get him or her to feed. Breastfeeding times will vary from baby to baby. However, the following rules can serve as a guide to help you make sure that your baby is properly fed:  Newborns (babies 63 weeks of age or younger) may breastfeed every 1-3 hours.  Newborns should not go without breastfeeding for longer than 3 hours during the day or 5 hours during the night.  You should breastfeed your baby a  minimum of 8 times in a 24-hour period. Breast milk pumping     Pumping and storing breast milk allows you to make sure that your baby is exclusively fed your breast milk, even at times when you are unable to breastfeed. This is especially important if you go back to work while you are still breastfeeding, or if you are not able to be present during feedings. Your lactation consultant can help you find a method of pumping that works best for you and give you guidelines about how long it is safe to store breast milk. Caring for your breasts while you breastfeed Nipples can become dry, cracked, and sore while breastfeeding.  The following recommendations can help keep your breasts moisturized and healthy:  Avoid using soap on your nipples.  Wear a supportive bra designed especially for nursing. Avoid wearing underwire-style bras or extremely tight bras (sports bras).  Air-dry your nipples for 3-4 minutes after each feeding.  Use only cotton bra pads to absorb leaked breast milk. Leaking of breast milk between feedings is normal.  Use lanolin on your nipples after breastfeeding. Lanolin helps to maintain your skin's normal moisture barrier. Pure lanolin is not harmful (not toxic) to your baby. You may also hand express a few drops of breast milk and gently massage that milk into your nipples and allow the milk to air-dry. In the first few weeks after giving birth, some women experience breast engorgement. Engorgement can make your breasts feel heavy, warm, and tender to the touch. Engorgement peaks within 3-5 days after you give birth. The following recommendations can help to ease engorgement:  Completely empty your breasts while breastfeeding or pumping. You may want to start by applying warm, moist heat (in the shower or with warm, water-soaked hand towels) just before feeding or pumping. This increases circulation and helps the milk flow. If your baby does not completely empty your breasts while  breastfeeding, pump any extra milk after he or she is finished.  Apply ice packs to your breasts immediately after breastfeeding or pumping, unless this is too uncomfortable for you. To do this: ? Put ice in a plastic bag. ? Place a towel between your skin and the bag. ? Leave the ice on for 20 minutes, 2-3 times a day.  Make sure that your baby is latched on and positioned properly while breastfeeding. If engorgement persists after 48 hours of following these recommendations, contact your health care provider or a Advertising copywriter. Overall health care recommendations while breastfeeding  Eat 3 healthy meals and 3 snacks every day. Well-nourished mothers who are breastfeeding need an additional 450-500 calories a day. You can meet this requirement by increasing the amount of a balanced diet that you eat.  Drink enough water to keep your urine pale yellow or clear.  Rest often, relax, and continue to take your prenatal vitamins to prevent fatigue, stress, and low vitamin and mineral levels in your body (nutrient deficiencies).  Do not use any products that contain nicotine or tobacco, such as cigarettes and e-cigarettes. Your baby may be harmed by chemicals from cigarettes that pass into breast milk and exposure to secondhand smoke. If you need help quitting, ask your health care provider.  Avoid alcohol.  Do not use illegal drugs or marijuana.  Talk with your health care provider before taking any medicines. These include over-the-counter and prescription medicines as well as vitamins and herbal supplements. Some medicines that may be harmful to your baby can pass through breast milk.  It is possible to become pregnant while breastfeeding. If birth control is desired, ask your health care provider about options that will be safe while breastfeeding your baby. Where to find more information: [employer]: www.llli.org Contact a health care provider if:  You feel like  you want to stop breastfeeding or have become frustrated with breastfeeding.  Your nipples are cracked or bleeding.  Your breasts are red, tender, or warm.  You have: ? Painful breasts or nipples. ? A swollen area on either breast. ? A fever or chills. ? Nausea or vomiting. ? Drainage other than breast milk from your nipples.  Your breasts do not become full  before feedings by the fifth day after you give birth.  You feel sad and depressed.  Your baby is: ? Too sleepy to eat well. ? Having trouble sleeping. ? More than 741 week old and wetting fewer than 6 diapers in a 24-hour period. ? Not gaining weight by 565 days of age.  Your baby has fewer than 3 stools in a 24-hour period.  Your baby's skin or the white parts of his or her eyes become yellow. Get help right away if:  Your baby is overly tired (lethargic) and does not want to wake up and feed.  Your baby develops an unexplained fever. Summary  Breastfeeding offers many health benefits for infant and mothers.  Try to breastfeed your infant when he or she shows early signs of hunger.  Gently tickle or stroke your baby's lips with your finger or nipple to allow the baby to open his or her mouth. Bring the baby to your breast. Make sure that much of the areola is in your baby's mouth. Offer one side and burp the baby before you offer the other side.  Talk with your health care provider or lactation consultant if you have questions or you face problems as you breastfeed. This information is not intended to replace advice given to you by your health care provider. Make sure you discuss any questions you have with your health care provider. Document Released: 09/18/2005 Document Revised: 10/20/2016 Document Reviewed: 10/20/2016 Elsevier Interactive Patient Education  2019 [employer] Home Care Instructions for Mom Activity  Gradually return to your regular activities.  Let yourself rest. Nap while your baby  sleeps.  Avoid lifting anything that is heavier than 10 lb (4.5 kg) until your health care provider says it is okay.  Avoid activities that take a lot of effort and energy (are strenuous) until approved by your health care provider. Walking at a slow-to-moderate pace is usually safe.  If you had a cesarean delivery: ? Do not vacuum, climb stairs, or drive a car for 4-6 weeks. ? Have someone help you at home until you feel like you can do your usual activities yourself. ? Do exercises as told by your health care provider, if this applies. Vaginal bleeding You may continue to bleed for 4-6 weeks after delivery. Over time, the amount of blood usually decreases and the color of the blood usually gets lighter. However, the flow of bright red blood may increase if you have been too active. If you need to use more than one pad in an hour because your pad gets soaked, or if you pass a large clot:  Lie down.  Raise your feet.  Place a cold compress on your lower abdomen.  Rest.  Call your health care provider. If you are breastfeeding, your period should return anytime between 8 weeks after delivery and the time that you stop breastfeeding. If you are not breastfeeding, your period should return 6-8 weeks after delivery. Perineal care The perineal area, or perineum, is the part of your body between your thighs. After delivery, this area needs special care. Follow these instructions as told by your health care provider.  Take warm tub baths for 15-20 minutes.  Use medicated pads and pain-relieving sprays and creams as told.  Do not use tampons or douches until vaginal bleeding has stopped.  Each time you go to the bathroom: ? Use a peri bottle. ? Change your pad. ? Use towelettes in place of toilet paper until your stitches have healed.  Do Kegel exercises every day. Kegel exercises help to maintain the muscles that support the vagina, bladder, and bowels. You can do these exercises while  you are standing, sitting, or lying down. To do Kegel exercises: ? Tighten the muscles of your abdomen and the muscles that surround your birth canal. ? Hold for a few seconds. ? Relax. ? Repeat until you have done this 5 times in a row.  To prevent hemorrhoids from developing or getting worse: ? Drink enough fluid to keep your urine clear or pale yellow. ? Avoid straining when having a bowel movement. ? Take over-the-counter medicines and stool softeners as told by your health care provider. Breast care  Wear a tight-fitting bra.  Avoid taking over-the-counter pain medicine for breast discomfort.  Apply ice to the breasts to help with discomfort as needed: ? Put ice in a plastic bag. ? Place a towel between your skin and the bag. ? Leave the ice on for 20 minutes or as told by your health care provider. Nutrition  Eat a well-balanced diet.  Do not try to lose weight quickly by cutting back on calories.  Take your prenatal vitamins until your postpartum checkup or until your health care provider tells you to stop. Postpartum depression You may find yourself crying for no apparent reason and unable to cope with all of the changes that come with having a newborn. This mood is called postpartum depression. Postpartum depression happens because your hormone levels change after delivery. If you have postpartum depression, get support from your partner, friends, and family. If the depression does not go away on its own after several weeks, contact your health care provider. Breast self-exam  Do a breast self-exam each month, at the same time of the month. If you are breastfeeding, check your breasts just after a feeding, when your breasts are less full. If you are breastfeeding and your period has started, check your breasts on day 5, 6, or 7 of your period. Report any lumps, bumps, or discharge to your health care provider. Know that breasts are normally lumpy if you are breastfeeding.  This is temporary, and it is not a health risk. Intimacy and sexuality Avoid sexual activity for at least 3-4 weeks after delivery or until the brownish-red vaginal flow is completely gone. If you want to avoid pregnancy, use some form of birth control. You can get pregnant after delivery, even if you have not had your period. Contact a health care provider if:  You feel unable to cope with the changes that a child brings to your life, and these feelings do not go away after several weeks.  You notice a lump, a bump, or discharge on your breast. Get help right away if:  Blood soaks your pad in 1 hour or less.  You have: ? Severe pain or cramping in your lower abdomen. ? A bad-smelling vaginal discharge. ? A fever that is not controlled by medicine. ? A fever, and an area of your breast is red and sore. ? Pain or redness in your calf. ? Sudden, severe chest pain. ? Shortness of breath. ? Painful or bloody urination. ? Problems with your vision. ? You vomit for 12 hours or longer. ? You develop a severe headache. ? You have serious thoughts about hurting yourself, your child, or anyone else. This information is not intended to replace advice given to you by your health care provider. Make sure you discuss any questions you have with your health care  provider. Document Released: 09/15/2000 Document Revised: 11/14/2017 Document Reviewed: 03/22/2015 Elsevier Interactive Patient Education  2019 [employer] Postpartum Care After Vaginal Delivery This sheet gives you information about how to care for yourself from the time you deliver your baby to up to 6-12 weeks after delivery (postpartum period). Your health care provider may also give you more specific instructions. If you have problems or questions, contact your health care provider. Follow these instructions at home: Vaginal bleeding  It is normal to have vaginal bleeding (lochia) after delivery. Wear a sanitary pad for vaginal  bleeding and discharge. ? During the first week after delivery, the amount and appearance of lochia is often similar to a menstrual period. ? Over the next few weeks, it will gradually decrease to a dry, yellow-brown discharge. ? For most women, lochia stops completely by 4-6 weeks after delivery. Vaginal bleeding can vary from woman to woman.  Change your sanitary pads frequently. Watch for any changes in your flow, such as: ? A sudden increase in volume. ? A change in color. ? Large blood clots.  If you pass a blood clot from your vagina, save it and call your health care provider to discuss. Do not flush blood clots down the toilet before talking with your health care provider.  Do not use tampons or douches until your health care provider says this is safe.  If you are not breastfeeding, your period should return 6-8 weeks after delivery. If you are feeding your child breast milk only (exclusive breastfeeding), your period may not return until you stop breastfeeding. Perineal care  Keep the area between the vagina and the anus (perineum) clean and dry as told by your health care provider. Use medicated pads and pain-relieving sprays and creams as directed.  If you had a cut in the perineum (episiotomy) or a tear in the vagina, check the area for signs of infection until you are healed. Check for: ? More redness, swelling, or pain. ? Fluid or blood coming from the cut or tear. ? Warmth. ? Pus or a bad smell.  You may be given a squirt bottle to use instead of wiping to clean the perineum area after you go to the bathroom. As you start healing, you may use the squirt bottle before wiping yourself. Make sure to wipe gently.  To relieve pain caused by an episiotomy, a tear in the vagina, or swollen veins in the anus (hemorrhoids), try taking a warm sitz bath 2-3 times a day. A sitz bath is a warm water bath that is taken while you are sitting down. The water should only come up to your  hips and should cover your buttocks. Breast care  Within the first few days after delivery, your breasts may feel heavy, full, and uncomfortable (breast engorgement). Milk may also leak from your breasts. Your health care provider can suggest ways to help relieve the discomfort. Breast engorgement should go away within a few days.  If you are breastfeeding: ? Wear a bra that supports your breasts and fits you well. ? Keep your nipples clean and dry. Apply creams and ointments as told by your health care provider. ? You may need to use breast pads to absorb milk that leaks from your breasts. ? You may have uterine contractions every time you breastfeed for up to several weeks after delivery. Uterine contractions help your uterus return to its normal size. ? If you have any problems with breastfeeding, work with your health care provider or  lactation Research scientist (medical).  If you are not breastfeeding: ? Avoid touching your breasts a lot. Doing this can make your breasts produce more milk. ? Wear a good-fitting bra and use cold packs to help with swelling. ? Do not squeeze out (express) milk. This causes you to make more milk. Intimacy and sexuality  Ask your health care provider when you can engage in sexual activity. This may depend on: ? Your risk of infection. ? How fast you are healing. ? Your comfort and desire to engage in sexual activity.  You are able to get pregnant after delivery, even if you have not had your period. If desired, talk with your health care provider about methods of birth control (contraception). Medicines  Take over-the-counter and prescription medicines only as told by your health care provider.  If you were prescribed an antibiotic medicine, take it as told by your health care provider. Do not stop taking the antibiotic even if you start to feel better. Activity  Gradually return to your normal activities as told by your health care provider. Ask your health care  provider what activities are safe for you.  Rest as much as possible. Try to rest or take a nap while your baby is sleeping. Eating and drinking   Drink enough fluid to keep your urine pale yellow.  Eat high-fiber foods every day. These may help prevent or relieve constipation. High-fiber foods include: ? Whole grain cereals and breads. ? Brown rice. ? Beans. ? Fresh fruits and vegetables.  Do not try to lose weight quickly by cutting back on calories.  Take your prenatal vitamins until your postpartum checkup or until your health care provider tells you it is okay to stop. Lifestyle  Do not use any products that contain nicotine or tobacco, such as cigarettes and e-cigarettes. If you need help quitting, ask your health care provider.  Do not drink alcohol, especially if you are breastfeeding. General instructions  Keep all follow-up visits for you and your baby as told by your health care provider. Most women visit their health care provider for a postpartum checkup within the first 3-6 weeks after delivery. Contact a health care provider if:  You feel unable to cope with the changes that your child brings to your life, and these feelings do not go away.  You feel unusually sad or worried.  Your breasts become red, painful, or hard.  You have a fever.  You have trouble holding urine or keeping urine from leaking.  You have little or no interest in activities you used to enjoy.  You have not breastfed at all and you have not had a menstrual period for 12 weeks after delivery.  You have stopped breastfeeding and you have not had a menstrual period for 12 weeks after you stopped breastfeeding.  You have questions about caring for yourself or your baby.  You pass a blood clot from your vagina. Get help right away if:  You have chest pain.  You have difficulty breathing.  You have sudden, severe leg pain.  You have severe pain or cramping in your lower  abdomen.  You bleed from your vagina so much that you fill more than one sanitary pad in one hour. Bleeding should not be heavier than your heaviest period.  You develop a severe headache.  You faint.  You have blurred vision or spots in your vision.  You have bad-smelling vaginal discharge.  You have thoughts about hurting yourself or your baby. If you  ever feel like you may hurt yourself or others, or have thoughts about taking your own life, get help right away. You can go to the nearest emergency department or call:  Your local emergency services (911 in the [place]  A suicide crisis helpline, such as the National Suicide Prevention Lifeline at 754-116-1011. This is open 24 hours a day. Summary  The period of time right after you deliver your newborn up to 6-12 weeks after delivery is called the postpartum period.  Gradually return to your normal activities as told by your health care provider.  Keep all follow-up visits for you and your baby as told by your health care provider. This information is not intended to replace advice given to you by your health care provider. Make sure you discuss any questions you have with your health care provider. Document Released: 07/16/2007 Document Revised: 07/02/2017 Document Reviewed: 07/02/2017 Elsevier Interactive Patient Education  2019 [employer]. Postpartum Baby Blues The postpartum period begins right after the birth of a baby. During this time, there is often a lot of joy and excitement. It is also a time of many changes in the life of the parents. No matter how many times a mother gives birth, each child brings new challenges to the family, including different ways of relating to one another. It is common to have feelings of excitement along with confusing changes in moods, emotions, and thoughts. You may feel happy one minute and sad or stressed the next. These feelings of sadness usually happen in the period right after you have  your baby, and they go away within a week or two. This is called the "baby blues." What are the causes? There is no known cause of baby blues. It is likely caused by a combination of factors. However, changes in hormone levels after childbirth are believed to trigger some of the symptoms. Other factors that can play a role in these mood changes include:  Lack of sleep.  Stressful life events, such as poverty, caring for a loved one, or death of a loved one.  Genetics. What are the signs or symptoms? Symptoms of this condition include:  Brief changes in mood, such as going from extreme happiness to sadness.  Decreased concentration.  Difficulty sleeping.  Crying spells and tearfulness.  Loss of appetite.  Irritability.  Anxiety. If the symptoms of baby blues last for more than 2 weeks or become more severe, you may have postpartum depression. How is this diagnosed? This condition is diagnosed based on an evaluation of your symptoms. There are no medical or lab tests that lead to a diagnosis, but there are various questionnaires that a health care provider may use to identify women with the baby blues or postpartum depression. How is this treated? Treatment is not needed for this condition. The baby blues usually go away on their own in 1-2 weeks. Social support is often all that is needed. You will be encouraged to get adequate sleep and rest. Follow these instructions at home: Lifestyle      Get as much rest as you can. Take a nap when the baby sleeps.  Exercise regularly as told by your health care provider. Some women find yoga and walking to be helpful.  Eat a balanced and nourishing diet. This includes plenty of fruits and vegetables, whole grains, and lean proteins.  Do little things that you enjoy. Have a cup of tea, take a bubble bath, read your favorite magazine, or listen to your favorite  music.  Avoid alcohol.  Ask for help with household chores, cooking,  grocery shopping, or running errands. Do not try to do everything yourself. Consider hiring a postpartum doula to help. This is a professional who specializes in providing support to new mothers.  Try not to make any major life changes during pregnancy or right after giving birth. This can add stress. General instructions  Talk to people close to you about how you are feeling. Get support from your partner, family members, friends, or other new moms. You may want to join a support group.  Find ways to cope with stress. This may include: ? Writing your thoughts and feelings in a journal. ? Spending time outside. ? Spending time with people who make you laugh.  Try to stay positive in how you think. Think about the things you are grateful for.  Take over-the-counter and prescription medicines only as told by your health care provider.  Let your health care provider know if you have any concerns.  Keep all postpartum visits as told by your health care provider. This is important. Contact a health care provider if:  Your baby blues do not go away after 2 weeks. Get help right away if:  You have thoughts of taking your own life (suicidal thoughts).  You think you may harm the baby or other people.  You see or hear things that are not there (hallucinations). Summary  After giving birth, you may feel happy one minute and sad or stressed the next. Feelings of sadness that happen right after the baby is born and go away after a week or two are called the "baby blues."  You can manage the baby blues by getting enough rest, eating a healthy diet, exercising, spending time with supportive people, and finding ways to cope with stress.  If feelings of sadness and stress last longer than 2 weeks or get in the way of caring for your baby, talk to your health care provider. This may mean you have postpartum depression. This information is not intended to replace advice given to you by your health  care provider. Make sure you discuss any questions you have with your health care provider. Document Released: 06/22/2004 Document Revised: 11/14/2016 Document Reviewed: 11/14/2016 Elsevier Interactive Patient Education  2019 [employer].

## 2019-02-24 LAB — RPR: RPR Ser Ql: NONREACTIVE

## 2019-02-25 ENCOUNTER — Other Ambulatory Visit: Payer: BC Managed Care – PPO

## 2019-02-25 ENCOUNTER — Encounter: Payer: BC Managed Care – PPO | Admitting: Certified Nurse Midwife

## 2019-02-25 ENCOUNTER — Telehealth: Payer: Self-pay | Admitting: Certified Nurse Midwife

## 2019-02-25 NOTE — Telephone Encounter (Signed)
Spoke with patient, c/o mild swelling in both feet since yesterday, no HA or visual changes, no pain.  Patient states that she has been resting and trying not to move around a lot.  Advised patient that she should move around to help circulation.  Breast feeding going "good".  Patient will pick up Ibuprofen from Pharmacy today.  Patient verbalized understanding and is aware to call back if symptoms get worse.

## 2019-02-25 NOTE — Telephone Encounter (Signed)
The patient called and stated that she is experiencing swelling in her feet and is very concerned and would like to speak with a nurse as soon as possible. Please advise.

## 2019-02-27 ENCOUNTER — Encounter: Payer: Self-pay | Admitting: Certified Nurse Midwife

## 2019-04-02 ENCOUNTER — Telehealth: Payer: Self-pay

## 2019-04-02 NOTE — Telephone Encounter (Signed)
Coronavirus (COVID-19) Are you at risk?  Are you at risk for the Coronavirus (COVID-19)?  To be considered HIGH RISK for Coronavirus (COVID-19), you have to meet the following criteria:  . Traveled to [place], [place], [place], [place] or [place]; or in the [place] to [place], [place], [place], or [place]; and have fever, cough, and shortness of breath within the last 2 weeks of travel OR . Been in close contact with a person diagnosed with COVID-19 within the last 2 weeks and have fever, cough, and shortness of breath . IF YOU DO NOT MEET THESE CRITERIA, YOU ARE CONSIDERED LOW RISK FOR COVID-19.  What to do if you are HIGH RISK for COVID-19?  . If you are having a medical emergency, call 911. . Seek medical care right away. Before you go to a doctor's office, urgent care or emergency department, call ahead and tell them about your recent travel, contact with someone diagnosed with COVID-19, and your symptoms. You should receive instructions from your physician's office regarding next steps of care.  . When you arrive at healthcare provider, tell the healthcare staff immediately you have returned from visiting [place], [place], [place], [place] or [place]; or traveled in the [place] to [place], [place], [place], or [place]; in the last two weeks or you have been in close contact with a person diagnosed with COVID-19 in the last 2 weeks.   . Tell the health care staff about your symptoms: fever, cough and shortness of breath. . After you have been seen by a medical provider, you will be either: o Tested for (COVID-19) and discharged home on quarantine except to seek medical care if symptoms worsen, and asked to  - Stay home and avoid contact with others until you get your results (4-5 days)  - Avoid travel on public transportation if possible (such as bus, train, or airplane) or o Sent to the Emergency Department by EMS for evaluation, COVID-19 testing, and possible  admission depending on your condition and test results.  What to do if you are LOW RISK for COVID-19?  Reduce your risk of any infection by using the same precautions used for avoiding the common cold or flu:  . Wash your hands often with soap and warm water for at least 20 seconds.  If soap and water are not readily available, use an alcohol-based hand sanitizer with at least 60% alcohol.  . If coughing or sneezing, cover your mouth and nose by coughing or sneezing into the elbow areas of your shirt or coat, into a tissue or into your sleeve (not your hands). . Avoid shaking hands with others and consider head nods or verbal greetings only. . Avoid touching your eyes, nose, or mouth with unwashed hands.  . Avoid close contact with people who are [place]. . Avoid places or events with large numbers of people in one location, like concerts or sporting events. . Carefully consider travel plans you have or are making. . If you are planning any travel outside or inside the [place], visit the CDC's Travelers' Health webpage for the latest health notices. . If you have some symptoms but not all symptoms, continue to monitor at home and seek medical attention if your symptoms worsen. . If you are having a medical emergency, call 911.  04/02/19 SCREENING NEG SLS ADDITIONAL HEALTHCARE OPTIONS FOR PATIENTS  [facility] / e-Visit: [email or web address]         [facility]: [phone]    [facility]: [phone]                   [facility]: [phone]  

## 2019-04-03 ENCOUNTER — Other Ambulatory Visit: Payer: Self-pay

## 2019-04-03 ENCOUNTER — Encounter: Payer: Self-pay | Admitting: Certified Nurse Midwife

## 2019-04-03 ENCOUNTER — Ambulatory Visit (INDEPENDENT_AMBULATORY_CARE_PROVIDER_SITE_OTHER): Payer: BC Managed Care – PPO | Admitting: Certified Nurse Midwife

## 2019-04-03 DIAGNOSIS — Z3009 Encounter for other general counseling and advice on contraception: Secondary | ICD-10-CM

## 2019-04-03 NOTE — Patient Instructions (Signed)
Preventive Care 21-23 Years Old, Female Preventive care refers to visits with your health care provider and lifestyle choices that can promote health and wellness. This includes:  A yearly physical exam. This may also be called an annual well check.  Regular dental visits and eye exams.  Immunizations.  Screening for certain conditions.  Healthy lifestyle choices, such as eating a healthy diet, getting regular exercise, not using drugs or products that contain nicotine and tobacco, and limiting alcohol use. What can I expect for my preventive care visit? Physical exam Your health care provider will check your:  Height and weight. This may be used to calculate body mass index (BMI), which tells if you are at a healthy weight.  Heart rate and blood pressure.  Skin for abnormal spots. Counseling Your health care provider may ask you questions about your:  Alcohol, tobacco, and drug use.  Emotional well-being.  Home and relationship well-being.  Sexual activity.  Eating habits.  Work and work environment.  Method of birth control.  Menstrual cycle.  Pregnancy history. What immunizations do I need?  Influenza (flu) vaccine  This is recommended every year. Tetanus, diphtheria, and pertussis (Tdap) vaccine  You may need a Td booster every 10 years. Varicella (chickenpox) vaccine  You may need this if you have not been vaccinated. Human papillomavirus (HPV) vaccine  If recommended by your health care provider, you may need three doses over 6 months. Measles, mumps, and rubella (MMR) vaccine  You may need at least one dose of MMR. You may also need a second dose. Meningococcal conjugate (MenACWY) vaccine  One dose is recommended if you are age 19-21 years and a first-year college student living in a residence hall, or if you have one of several medical conditions. You may also need additional booster doses. Pneumococcal conjugate (PCV13) vaccine  You may need  this if you have certain conditions and were not previously vaccinated. Pneumococcal polysaccharide (PPSV23) vaccine  You may need one or two doses if you smoke cigarettes or if you have certain conditions. Hepatitis A vaccine  You may need this if you have certain conditions or if you travel or work in places where you may be exposed to hepatitis A. Hepatitis B vaccine  You may need this if you have certain conditions or if you travel or work in places where you may be exposed to hepatitis B. Haemophilus influenzae type b (Hib) vaccine  You may need this if you have certain conditions. You may receive vaccines as individual doses or as more than one vaccine together in one shot (combination vaccines). Talk with your health care provider about the risks and benefits of combination vaccines. What tests do I need?  Blood tests  Lipid and cholesterol levels. These may be checked every 5 years starting at age 20.  Hepatitis C test.  Hepatitis B test. Screening  Diabetes screening. This is done by checking your blood sugar (glucose) after you have not eaten for a while (fasting).  Sexually transmitted disease (STD) testing.  BRCA-related cancer screening. This may be done if you have a family history of breast, ovarian, tubal, or peritoneal cancers.  Pelvic exam and Pap test. This may be done every 3 years starting at age 21. Starting at age 30, this may be done every 5 years if you have a Pap test in combination with an HPV test. Talk with your health care provider about your test results, treatment options, and if necessary, the need for more tests.   Follow these instructions at home: Eating and drinking   Eat a diet that includes fresh fruits and vegetables, whole grains, lean protein, and low-fat dairy.  Take vitamin and mineral supplements as recommended by your health care provider.  Do not drink alcohol if: ? Your health care provider tells you not to drink. ? You are  pregnant, may be pregnant, or are planning to become pregnant.  If you drink alcohol: ? Limit how much you have to 0-1 drink a day. ? Be aware of how much alcohol is in your drink. In the [place] one drink equals one 12 oz bottle of beer (355 mL), one 5 oz glass of wine (148 mL), or one 1 oz glass of hard liquor (44 mL). Lifestyle  Take daily care of your teeth and gums.  Stay active. Exercise for at least 30 minutes on 5 or more days each week.  Do not use any products that contain nicotine or tobacco, such as cigarettes, e-cigarettes, and chewing tobacco. If you need help quitting, ask your health care provider.  If you are sexually active, practice safe sex. Use a condom or other form of birth control (contraception) in order to prevent pregnancy and STIs (sexually transmitted infections). If you plan to become pregnant, see your health care provider for a preconception visit. What's next?  Visit your health care provider once a year for a well check visit.  Ask your health care provider how often you should have your eyes and teeth checked.  Stay up to date on all vaccines. This information is not intended to replace advice given to you by your health care provider. Make sure you discuss any questions you have with your health care provider. Document Released: 11/14/2001 Document Revised: 05/30/2018 Document Reviewed: 05/30/2018 Elsevier Patient Education  2020 [employer] Inc.  

## 2019-04-03 NOTE — Progress Notes (Signed)
Subjective:    Tammy Contreras is a 23 y.o. [id number] Hispanic female who presents for a postpartum visit. She is 6 weeks postpartum following a spontaneous vaginal delivery at 40+2 gestational weeks. Anesthesia: none. I have fully reviewed the prenatal and intrapartum course.   Postpartum course has been uncomplicated. Baby's course has been uncomplicated. Baby is feeding by breast. Bleeding no bleeding. Bowel function is normal. Bladder function is normal.   Patient is not sexually active. Contraception method is abstinence; plans IUD placement, prefers periods monthly.   Postpartum depression screening: negative. Score 4.  Last pap 08/2018 and was negative.  Denies difficulty breathing or respiratory distress, chest pain, abdominal pain, excessive vaginal bleeding, dysuria, and leg pain or swelling.   The following portions of the patient's history were reviewed and updated as appropriate: allergies, current medications, past medical history, past surgical history and problem list.  Review of Systems  Pertinent items are noted in HPI.   Objective:   BP 99/64   Pulse 78   Ht 5\' 3"  (1.6 m)   Wt 149 lb 8 oz (67.8 kg)   Breastfeeding Yes   BMI 26.48 kg/m   General:  alert, cooperative and no distress   Breasts:  deferred, no complaints  Lungs: clear to auscultation bilaterally  Heart:  regular rate and rhythm  Abdomen: soft, nontender   Vulva: normal  Vagina: normal vagina  Cervix:  closed  Corpus: Well-involuted  Adnexa:  Non-palpable   Depression screen [facility] 2/9 04/03/2019  Decreased Interest 0  Down, Depressed, Hopeless 0  PHQ - 2 Score 0  Altered sleeping 2  Tired, decreased energy 2  Change in appetite 0  Feeling bad or failure about yourself  0  Trouble concentrating 0  Moving slowly or fidgety/restless 0  Suicidal thoughts 0  PHQ-9 Score 4  Difficult doing work/chores Not difficult at all         Assessment:   Postpartum exam Six (6) wks s/p  spontaneous vaginal birth Breastfeeding Depression screening Contraception counseling   Plan:   May return to work without restriction  Encouraged routine health maintenance techniques   Desires IUD prefers periods, [place] brochure given  Reviewed red flag symptoms and when to call  Follow up in: 2 weeks for IUD insertion or earlier if needed   Diona Fanti, CNM [facility], [facility] 04/03/19 11:43 AM

## 2019-04-03 NOTE — Progress Notes (Signed)
Patient here for post partum visit.  No complaints.  

## 2019-04-23 ENCOUNTER — Telehealth: Payer: Self-pay

## 2019-04-23 NOTE — Telephone Encounter (Signed)
Coronavirus (COVID-19) Are you at risk?  Are you at risk for the Coronavirus (COVID-19)?  To be considered HIGH RISK for Coronavirus (COVID-19), you have to meet the following criteria:  . Traveled to [place], [place], [place], [place] or [place]; or in the [place] to [place], [place], [place], or [place]; and have fever, cough, and shortness of breath within the last 2 weeks of travel OR . Been in close contact with a person diagnosed with COVID-19 within the last 2 weeks and have fever, cough, and shortness of breath . IF YOU DO NOT MEET THESE CRITERIA, YOU ARE CONSIDERED LOW RISK FOR COVID-19.  What to do if you are HIGH RISK for COVID-19?  . If you are having a medical emergency, call 911. . Seek medical care right away. Before you go to a doctor's office, urgent care or emergency department, call ahead and tell them about your recent travel, contact with someone diagnosed with COVID-19, and your symptoms. You should receive instructions from your physician's office regarding next steps of care.  . When you arrive at healthcare provider, tell the healthcare staff immediately you have returned from visiting [place], [place], [place], [place] or [place]; or traveled in the [place] to [place], [place], [place], or [place]; in the last two weeks or you have been in close contact with a person diagnosed with COVID-19 in the last 2 weeks.   . Tell the health care staff about your symptoms: fever, cough and shortness of breath. . After you have been seen by a medical provider, you will be either: o Tested for (COVID-19) and discharged home on quarantine except to seek medical care if symptoms worsen, and asked to  - Stay home and avoid contact with others until you get your results (4-5 days)  - Avoid travel on public transportation if possible (such as bus, train, or airplane) or o Sent to the Emergency Department by EMS for evaluation, COVID-19 testing, and possible  admission depending on your condition and test results.  What to do if you are LOW RISK for COVID-19?  Reduce your risk of any infection by using the same precautions used for avoiding the common cold or flu:  . Wash your hands often with soap and warm water for at least 20 seconds.  If soap and water are not readily available, use an alcohol-based hand sanitizer with at least 60% alcohol.  . If coughing or sneezing, cover your mouth and nose by coughing or sneezing into the elbow areas of your shirt or coat, into a tissue or into your sleeve (not your hands). . Avoid shaking hands with others and consider head nods or verbal greetings only. . Avoid touching your eyes, nose, or mouth with unwashed hands.  . Avoid close contact with people who are [place]. . Avoid places or events with large numbers of people in one location, like concerts or sporting events. . Carefully consider travel plans you have or are making. . If you are planning any travel outside or inside the [place], visit the CDC's Travelers' Health webpage for the latest health notices. . If you have some symptoms but not all symptoms, continue to monitor at home and seek medical attention if your symptoms worsen. . If you are having a medical emergency, call 911.  04/23/19 SCREENING NEG SLS ADDITIONAL HEALTHCARE OPTIONS FOR PATIENTS  [facility] / e-Visit: [email or web address]         [facility]: [phone]     Urgent Care: [phone]                   [facility]: [phone]  

## 2019-04-24 ENCOUNTER — Encounter: Payer: Self-pay | Admitting: Certified Nurse Midwife

## 2019-04-24 ENCOUNTER — Ambulatory Visit (INDEPENDENT_AMBULATORY_CARE_PROVIDER_SITE_OTHER): Payer: BC Managed Care – PPO | Admitting: Certified Nurse Midwife

## 2019-04-24 ENCOUNTER — Other Ambulatory Visit: Payer: Self-pay

## 2019-04-24 VITALS — BP 96/63 | HR 63 | Ht 63.0 in | Wt 149.4 lb

## 2019-04-24 DIAGNOSIS — Z3202 Encounter for pregnancy test, result negative: Secondary | ICD-10-CM | POA: Diagnosis not present

## 2019-04-24 DIAGNOSIS — Z30430 Encounter for insertion of intrauterine contraceptive device: Secondary | ICD-10-CM | POA: Diagnosis not present

## 2019-04-24 DIAGNOSIS — Z30014 Encounter for initial prescription of intrauterine contraceptive device: Secondary | ICD-10-CM

## 2019-04-24 LAB — POCT URINE PREGNANCY: Preg Test, Ur: NEGATIVE

## 2019-04-24 NOTE — Patient Instructions (Signed)
IUD PLACEMENT POST-PROCEDURE INSTRUCTIONS  1. You may take Ibuprofen, Aleve or Tylenol for pain if needed.  Cramping should resolve within in 24 hours.  2. You may have a small amount of spotting.  You should wear a mini pad for the next few days.  3. You may have intercourse after 48 hours.  If you using this for birth control, it is effective immediately.  4. You need to call if you have any pelvic pain, fever, heavy bleeding or foul smelling vaginal discharge.  Irregular bleeding is common the first several months after having an IUD placed. You do not need to call for this reason unless you are concerned.  5. Shower or bathe as normal  6. You should have a follow-up appointment in 6-8 weeks for a re-check to make sure you are not having any problems.

## 2019-04-24 NOTE — Progress Notes (Signed)
Patient here for Mirena IUD insertion.  

## 2019-04-24 NOTE — Progress Notes (Signed)
Tammy Contreras Tammy Contreras is a 23 y.o. year old G22P2002 Hispanic female who presents for placement of a Mirena IUD.  BP 96/63   Pulse 63   Ht 5\' 3"  (1.6 m)   Wt 149 lb 6.4 oz (67.8 kg)   LMP  (LMP Unknown)   Breastfeeding Yes   BMI 26.47 kg/m   Pregnancy test today was negative.   The risks and benefits of the method and placement have been thouroughly reviewed with the patient and all questions were answered.  Specifically the patient is aware of failure rate of 10/998, expulsion of the IUD and of possible perforation.  The patient is aware of irregular bleeding due to the method and understands the incidence of irregular bleeding diminishes with time.  Signed copy of informed consent in chart.   Time out was performed.  A small plastic speculum was placed in the vagina.  The cervix was visualized, prepped using Betadine, and grasped with a single tooth tenaculum. The uterus was sounded to 8 cm.  Mirena IUD placed per manufacturer's recommendations.   The strings were trimmed to 3 cm.  The patient was given post procedure instructions, including signs and symptoms of infection and to check for the strings after each menses or each month, and refraining from intercourse or anything in the vagina for 3 days.  She was given a Mirena care card with date Mirena placed, and date Mirena to be removed.  Reviewed red flag symptoms and when to call.   RTC x 6-8 weeks for IUD string check or sooner if needed.    Diona Fanti, CNM [facility], [facility] 04/24/19 3:05 PM   NDC: 66599-357-01 Lot: [id number] Exp: 03/2021

## 2019-04-29 ENCOUNTER — Encounter: Payer: Self-pay | Admitting: Certified Nurse Midwife

## 2019-04-30 NOTE — Telephone Encounter (Signed)
Patient called and stated that she needs her completed paperwork to be faxed to [phone]. Please advise.

## 2019-05-05 ENCOUNTER — Encounter: Payer: Self-pay | Admitting: Certified Nurse Midwife

## 2019-05-16 ENCOUNTER — Encounter: Payer: Self-pay | Admitting: Certified Nurse Midwife

## 2019-06-10 ENCOUNTER — Encounter: Payer: Self-pay | Admitting: Certified Nurse Midwife

## 2019-06-19 ENCOUNTER — Encounter: Payer: BC Managed Care – PPO | Admitting: Certified Nurse Midwife

## 2019-06-27 ENCOUNTER — Encounter: Payer: BC Managed Care – PPO | Admitting: Certified Nurse Midwife

## 2019-07-04 ENCOUNTER — Other Ambulatory Visit: Payer: Self-pay

## 2019-07-04 ENCOUNTER — Encounter: Payer: Self-pay | Admitting: Certified Nurse Midwife

## 2019-07-04 ENCOUNTER — Ambulatory Visit (INDEPENDENT_AMBULATORY_CARE_PROVIDER_SITE_OTHER): Payer: BC Managed Care – PPO | Admitting: Certified Nurse Midwife

## 2019-07-04 VITALS — BP 101/63 | HR 84 | Ht 63.0 in | Wt 150.2 lb

## 2019-07-04 DIAGNOSIS — Z30431 Encounter for routine checking of intrauterine contraceptive device: Secondary | ICD-10-CM | POA: Diagnosis not present

## 2019-07-04 NOTE — Progress Notes (Signed)
  GYNECOLOGY OFFICE ENCOUNTER NOTE  History:  23 y.o. [id number] here today for today for IUD string check; Mirena  IUD was placed  04/24/2019. Reports intermittent abdominal cramping requiring the use of Motrin, last episode on Monday.   Denies difficulty breathing or respiratory distress, chest pain, dysuria, and leg pain or swelling.   The following portions of the patient's history were reviewed and updated as appropriate: allergies, current medications, past family history, past medical history, past social history, past surgical history and problem list. Last pap smear on 08/2018 was normal.  Review of Systems:   Pertinent items are noted in HPI.  Objective:   Blood pressure 101/63, pulse 84, height 5\' 3"  (1.6 m), weight 150 lb 3.2 oz (68.1 kg), currently breastfeeding.   Physical Exam  CONSTITUTIONAL: Well-developed, well-nourished female in no acute distress.   ABDOMEN: Soft, no distention noted. Diastasis recti present.   PELVIC: Normal appearing external genitalia; normal appearing vaginal mucosa and cervix.  IUD strings visualized, about three (3) cm in length outside cervix.   Assessment & Plan:   Patient to keep IUD in place for up to five (5)  years; can come in for removal if she desires pregnancy earlier or for any concerning side effects.  Advised routine health maintenance techniques.  Reviewed red flag symptoms and when to call.   RTC x 6 months for ANNUAL EXAM or sooner if needed.    Diona Fanti, CNM [facility], [facility] 07/04/19 2:00 PM

## 2019-07-04 NOTE — Progress Notes (Signed)
Patient here to follow-up after having Mirena IUD insert, c/o intermittent "severe" abdominal pain x1 month.  Patient takes Ibuprofen with relief.

## 2019-07-04 NOTE — Patient Instructions (Addendum)
Levonorgestrel intrauterine device (IUD) What is this medicine? LEVONORGESTREL IUD (LEE voe nor jes trel) is a contraceptive (birth control) device. The device is placed inside the uterus by a healthcare professional. It is used to prevent pregnancy. This device can also be used to treat heavy bleeding that occurs during your period. This medicine may be used for other purposes; ask your health care provider or pharmacist if you have questions. COMMON BRAND NAME(S): Kyleena, LILETTA, Mirena, Skyla What should I tell my health care provider before I take this medicine? They need to know if you have any of these conditions:  abnormal Pap smear  cancer of the breast, uterus, or cervix  diabetes  endometritis  genital or pelvic infection now or in the past  have more than one sexual partner or your partner has more than one partner  heart disease  history of an ectopic or tubal pregnancy  immune system problems  IUD in place  liver disease or tumor  problems with blood clots or take blood-thinners  seizures  use intravenous drugs  uterus of unusual shape  vaginal bleeding that has not been explained  an unusual or allergic reaction to levonorgestrel, other hormones, silicone, or polyethylene, medicines, foods, dyes, or preservatives  pregnant or trying to get pregnant  breast-feeding How should I use this medicine? This device is placed inside the uterus by a health care professional. Talk to your pediatrician regarding the use of this medicine in children. Special care may be needed. Overdosage: If you think you have taken too much of this medicine contact a poison control center or emergency room at once. NOTE: This medicine is only for you. Do not share this medicine with others. What if I miss a dose? This does not apply. Depending on the brand of device you have inserted, the device will need to be replaced every 3 to 6 years if you wish to continue using this type  of birth control. What may interact with this medicine? Do not take this medicine with any of the following medications:  amprenavir  bosentan  fosamprenavir This medicine may also interact with the following medications:  aprepitant  armodafinil  barbiturate medicines for inducing sleep or treating seizures  bexarotene  boceprevir  griseofulvin  medicines to treat seizures like carbamazepine, ethotoin, felbamate, oxcarbazepine, phenytoin, topiramate  modafinil  pioglitazone  rifabutin  rifampin  rifapentine  some medicines to treat HIV infection like atazanavir, efavirenz, indinavir, lopinavir, nelfinavir, tipranavir, ritonavir  St. John's wort  warfarin This list may not describe all possible interactions. Give your health care provider a list of all the medicines, herbs, non-prescription drugs, or dietary supplements you use. Also tell them if you smoke, drink alcohol, or use illegal drugs. Some items may interact with your medicine. What should I watch for while using this medicine? Visit your doctor or health care professional for regular check ups. See your doctor if you or your partner has sexual contact with others, becomes HIV positive, or gets a sexual transmitted disease. This product does not protect you against HIV infection (AIDS) or other sexually transmitted diseases. You can check the placement of the IUD yourself by reaching up to the top of your vagina with clean fingers to feel the threads. Do not pull on the threads. It is a good habit to check placement after each menstrual period. Call your doctor right away if you feel more of the IUD than just the threads or if you cannot feel the threads at   all. The IUD may come out by itself. You may become pregnant if the device comes out. If you notice that the IUD has come out use a backup birth control method like condoms and call your health care provider. Using tampons will not change the position of the  IUD and are okay to use during your period. This IUD can be safely scanned with magnetic resonance imaging (MRI) only under specific conditions. Before you have an MRI, tell your healthcare provider that you have an IUD in place, and which type of IUD you have in place. What side effects may I notice from receiving this medicine? Side effects that you should report to your doctor or health care professional as soon as possible:  allergic reactions like skin rash, itching or hives, swelling of the face, lips, or tongue  fever, flu-like symptoms  genital sores  high blood pressure  no menstrual period for 6 weeks during use  pain, swelling, warmth in the leg  pelvic pain or tenderness  severe or sudden headache  signs of pregnancy  stomach cramping  sudden shortness of breath  trouble with balance, talking, or walking  unusual vaginal bleeding, discharge  yellowing of the eyes or skin Side effects that usually do not require medical attention (report to your doctor or health care professional if they continue or are bothersome):  acne  breast pain  change in sex drive or performance  changes in weight  cramping, dizziness, or faintness while the device is being inserted  headache  irregular menstrual bleeding within first 3 to 6 months of use  nausea This list may not describe all possible side effects. Call your doctor for medical advice about side effects. You may report side effects to FDA at 1-800-FDA-1088. Where should I keep my medicine? This does not apply. NOTE: This sheet is a summary. It may not cover all possible information. If you have questions about this medicine, talk to your doctor, pharmacist, or health care provider.  2020 Elsevier/Gold Standard (2018-07-30 13:22:01)  Diastasis Recti  Diastasis recti is when the muscles of the abdomen (rectus abdominis muscles) become thin and separate. The result is a wider space between the right and left  abdomen (abdominal) muscles. This wider space between the muscles may cause a bulge in the middle of your abdomen. You may notice this bulge when you are straining or when you sit up from a lying down position. Diastasis recti can affect men and women. It is most common among pregnant women, infants, people who are obese, and people who have had abdominal surgery. Exercise or surgical treatment may help correct it. What are the causes? Common causes of this condition include:  Pregnancy. The growing uterus puts pressure on the abdominal muscles, which causes the muscles to separate.  Obesity. Excess fat puts pressure on abdominal muscles.  Weightlifting.  Some abdomen exercises.  Advanced age.  Genetics.  Prior abdominal surgery. What increases the risk? This condition is more likely to develop in:  Women.  Newborns, especially newborns who are born early (prematurely). What are the signs or symptoms? Common symptoms of this condition include:  A bulge in the middle of the abdomen. You will notice it most when you sit up or strain.  Pain in the low back, pelvis, or hips.  Constipation.  Inability to control when you urinate (urinary incontinence).  Bloating.  Poor posture. How is this diagnosed? This condition is diagnosed with a physical exam. Your health care provider will  ask you to lie flat on your back and do a crunch or half sit-up. If you have diastasis recti, a vertical bulge will appear between your abdominal muscles in the center of your abdomen. Your health care provider will measure the gap between your muscles with one of the following:  A medical device used to measure the space between two objects (caliper).  A tape measure.  CT scan.  Ultrasound.  Finger spaces. Your health care provider will measure the space using their fingers. How is this treated? If your muscle separation is not too large, you may not need treatment. However, if you are a woman  who plans to become pregnant again, you should treat this condition before your next pregnancy. Treatment may include:  Physical therapy to strengthen and tighten your abdominal muscles.  Lifestyle changes such as weight loss and exercise.  Over-the-counter pain medicines as needed.  Surgery to correct the separation. Follow these instructions at home: Activity  Return to your normal activities as told by your health care provider. Ask your health care provider what activities are safe for you.  When lifting weights or doing exercises using your abdominal muscles or the muscles in the center of your body that give stability (core muscles), make sure you are doing your exercises and movements correctly. Proper form can help to prevent the condition from happening again. General instructions  If you are overweight, ask your health care provider for help with weight loss. Losing even a small amount of weight can help to improve your diastasis recti.  Take over-the-counter or prescription medicines only as told by your health care provider.  Do not strain. Straining can make the separation worse. Examples of straining include: ? Pushing hard to have a bowel movement, such as due to constipation. ? Lifting heavy objects, including children. ? Standing up and sitting down.  Take steps to prevent constipation: ? Drink enough fluid to keep your urine clear or pale yellow. ? Take over-the-counter or prescription medicines only as directed. ? Eat foods that are high in fiber, such as fresh fruits and vegetables, whole grains, and beans. ? Limit foods that are high in fat and processed sugars, such as fried and sweet foods. Contact a health care provider if:  You notice a new bulge in your abdomen. Get help right away if:  You experience severe discomfort in your abdomen.  You develop severe abdominal pain along with nausea, vomiting, or fever. Summary  Diastasis recti is when the  abdomen (abdominal) muscles become thin and separate. Your abdomen will stick out because the space between your right and left abdomen muscles has widened.  The most common symptom is a bulge in your abdomen. You will notice it most when you sit up or are straining.  This condition is diagnosed during a physical exam.  If the abdomen separation is not too big, you may choose not to have treatment. Otherwise, you may need to undergo physical therapy or surgery. This information is not intended to replace advice given to you by your health care provider. Make sure you discuss any questions you have with your health care provider. Document Released: 11/13/2016 Document Revised: 08/31/2017 Document Reviewed: 11/13/2016 Elsevier Patient Education  2020 [employer].

## 2019-09-07 ENCOUNTER — Encounter: Payer: Self-pay | Admitting: Certified Nurse Midwife

## 2019-12-30 ENCOUNTER — Telehealth: Payer: Self-pay

## 2019-12-30 NOTE — Telephone Encounter (Signed)
Patient states that she had been experiencing cracking and bleeding nipples since her baby was born but states that she also experienced this during pregnancy starting around 30 weeks pregnancy. She was prescribed the all purpose nipple ointment by Midwife Melody at [facility].  Patient states that she experienced itchiness and irritation that started on the left breast and was present also on the right breast (nipple and entire breast). Patient states that the right breast improved after she gave birth but the itchiness, irritation of her left breast as well as bleeding and cracking of nipples on the left side remained after the birth and continued until now. Her baby is currently 54 months old. Patient states that she experienced this same itchiness and irritation during her first pregnancy. The itchiness and irritation returned after 6 months after the birth of her first child while breastfeeding when the child was diagnosed with thrush. Patient states that she experienced 4 yeast infections in her pregnancy with her last child. She was prescribed an oral medication and states that this improved.

## 2019-12-31 ENCOUNTER — Telehealth: Payer: Self-pay | Admitting: Certified Nurse Midwife

## 2019-12-31 DIAGNOSIS — B3789 Other sites of candidiasis: Secondary | ICD-10-CM

## 2019-12-31 MED ORDER — FLUCONAZOLE 200 MG PO TABS: 200.0000 mg | ORAL_TABLET | Freq: Every day | ORAL | 1 refills | Status: DC

## 2019-12-31 NOTE — Telephone Encounter (Signed)
1244 Telephone call from Werner Lean, Lactation consultant. After assessing patient, suspect yeast colonization. Recommends 14 days for treatment for the entire family.   CNM verbalized understanding and will reach out to patient regarding plan of care.    Gunnar Bulla, CNM [facility], [facility] 12/31/19 1:10 PM

## 2020-01-02 ENCOUNTER — Encounter: Payer: BC Managed Care – PPO | Admitting: Certified Nurse Midwife

## 2020-01-06 ENCOUNTER — Telehealth: Payer: Self-pay

## 2020-01-06 NOTE — Telephone Encounter (Signed)
Lactation consultant attempted to call Shawndra to follow up since last phone call. LC notified her Midwife on 12/31/19 of patient concerns and symptoms. LC left voicemail asking Tammy Contreras to call back to check progress.

## 2020-01-09 ENCOUNTER — Encounter: Payer: BC Managed Care – PPO | Admitting: Certified Nurse Midwife

## 2020-01-14 ENCOUNTER — Telehealth: Payer: Self-pay | Admitting: Lactation Services

## 2020-01-14 NOTE — Telephone Encounter (Signed)
LC returned call to patient, providing alternate options for in-person and virtual appointments on a different date. Patient [facility]

## 2020-01-14 NOTE — Telephone Encounter (Signed)
Patient called into Lactation department and left a voicemail for LC-Alicia. Patient stated that her "situation" and the medicine she was taking for that has made everything 10x worse, requesting a return phone call. LC-Andrus Sharp reached out to LC-Alicia since she was off today for guidance, LC-Alicia recommended outpatient appointment tomorrow: 01/15/20 @11am  if patient was available. LC-Habiba Treloar returned phone call, and left voicemail for Alicia's availability for tomorrow, requesting that patient call back for confirmation.

## 2020-01-14 NOTE — Telephone Encounter (Signed)
Client returned previous message left by [facility]. She states she will try to come at 11am tomorrow, but may be unable to get off of work due to short notice. She plans to call and leave message on [facility] office phone if she can/can't make it.

## 2020-01-20 ENCOUNTER — Other Ambulatory Visit (INDEPENDENT_AMBULATORY_CARE_PROVIDER_SITE_OTHER): Payer: BC Managed Care – PPO | Admitting: Certified Nurse Midwife

## 2020-01-20 ENCOUNTER — Other Ambulatory Visit: Payer: Self-pay

## 2020-01-20 ENCOUNTER — Ambulatory Visit
Admission: RE | Admit: 2020-01-20 | Discharge: 2020-01-20 | Disposition: A | Discharge status: Home / Self Care | Payer: BC Managed Care – PPO | Source: Ambulatory Visit | Attending: Certified Nurse Midwife | Admitting: Certified Nurse Midwife

## 2020-01-20 DIAGNOSIS — L309 Dermatitis, unspecified: Secondary | ICD-10-CM | POA: Insufficient documentation

## 2020-01-20 MED ORDER — TRIAMCINOLONE ACETONIDE 0.5 % EX OINT: 1.0000 "application " | TOPICAL_OINTMENT | Freq: Two times a day (BID) | CUTANEOUS | 0 refills | Status: DC

## 2020-01-20 NOTE — Lactation Note (Signed)
Lactation Consultation Note  Patient Name: Tammy Contreras KSHNG'I Date: 01/20/2020    Lindsay is here today for a consultation due to itchiness, redness, irritation, burning and inflammation of the skin accompanied by oozing papules on her left areola and nipple. She had been experiencing these symptoms since her baby was born almost 11 months ago. Shamaria had been on a 14 day oral treatment for yeast with no relief. She states that her symptoms worsened after the oral treatment. LC did a vinegar wash on her nipple to rule out yeast. After no relief following the vinegar wash and a visual assessment of her nipple, it is determined that she could have eczema that has flared. LC applied ice packs to the affected area. Lunabella stated that she felt some relief from the cold compresses. LC notified her midwife to prescribe Triamcinolone ointment. Bryanda stated that she would try the ointment and notify LC of any additional concerns.      Arlyss Gandy 01/20/2020, 3:42 PM

## 2020-01-20 NOTE — Progress Notes (Signed)
1353 Telephone call from Werner Lean, Lactation Consultant at [facility] [place].   Completed in person assessment. Believes patient is having an eczema flair.   Advises short term use of steroid cream.   Encouraged follow up as needed.    Gunnar Bulla, CNM [facility], [facility] 01/20/20 4:23 PM

## 2020-02-03 ENCOUNTER — Telehealth: Payer: Self-pay

## 2020-02-03 NOTE — Telephone Encounter (Signed)
Lactation made a follow up call with Tammy Contreras about the treatment to her left breast using the Triamcinolone ointment. Tammy Contreras stated that the ointment is working and her eczema is clearing. She asked about methods to wean. Lactation gave her tips on how to gently wean.

## 2020-03-02 ENCOUNTER — Telehealth: Payer: Self-pay | Admitting: Certified Nurse Midwife

## 2020-03-02 NOTE — Telephone Encounter (Signed)
Called pt to reschedule her last appt. The pt stated that she went back to her last doctor. I told the pt that I would take her off this list. Pt verbal understood

## 2020-06-09 ENCOUNTER — Other Ambulatory Visit: Payer: Self-pay

## 2021-02-23 ENCOUNTER — Encounter: Payer: Self-pay | Admitting: Certified Nurse Midwife

## 2021-06-17 ENCOUNTER — Other Ambulatory Visit: Payer: Self-pay

## 2021-06-17 ENCOUNTER — Encounter: Payer: Self-pay | Admitting: Obstetrics and Gynecology

## 2021-06-17 ENCOUNTER — Ambulatory Visit (INDEPENDENT_AMBULATORY_CARE_PROVIDER_SITE_OTHER): Payer: Medicaid Other | Admitting: Obstetrics and Gynecology

## 2021-06-17 VITALS — BP 119/78 | HR 68 | Resp 16 | Ht 63.0 in | Wt 157.3 lb

## 2021-06-17 DIAGNOSIS — Z30432 Encounter for removal of intrauterine contraceptive device: Secondary | ICD-10-CM

## 2021-06-17 DIAGNOSIS — R102 Pelvic and perineal pain: Secondary | ICD-10-CM

## 2021-06-17 NOTE — Patient Instructions (Signed)
Preparing for Pregnancy °If you are planning to become pregnant, talk to your health care provider about preconception care. This type of care helps you prepare for a safe and healthy pregnancy. During this visit, your health care provider will: °Do a complete physical exam, including a Pap test. °Take your complete medical history. °Give you information, answer your questions, and help you resolve problems. °Preconception checklist °Medical history °Tell your health care provider about any medical conditions you have or have had. Your pregnancy or your ability to become pregnant may be affected by long-term (chronic) conditions, such as: °Diabetes. °High blood pressure (hypertension). °Thyroid problems. °Tell your health care provider about your family's medical history and your partner's medical history. °Tell your health care provider if you have or have had any sexually transmitted infections, orSTIs. These can affect your pregnancy. In some cases, they can be passed to your baby. °If needed, discuss the benefits of genetic testing. This test checks for conditions that may be passed from parent to child. °Tell your health care provider about: °Any problems you had getting pregnant or while pregnant. °Any medicines you take. These include vitamins, herbal supplements, and over-the-counter medicines. °Your history of getting vaccines. Discuss any vaccines that you may need. °Diet °Ask your health care provider about what foods to eat in order to get a balance of nutrients. This is especially important when you are pregnant or preparing to become pregnant. It is recommended that women of childbearing age take a folic acid supplement of 400 mcg daily and eat foods rich in folic acid to prevent certain birth defects. °Ask your health care provider to help you reach a healthy weight before pregnancy. °If you are overweight, you may have a higher risk for certain problems. These include hypertension, diabetes, and  early (preterm) birth. °If you are underweight, you are more likely to have a baby who has a low birth weight. °Lifestyle, work, and home °Let your health care provider know about: °Any lifestyle habits that you have, such as use of alcohol, drugs, or tobacco products. °Fun and leisure activities that may put you at risk during pregnancy, such as downhill skiing and certain exercise programs. °Any plans to travel out of the country, especially to places with an active Zika virus outbreak. °Harmful substances that you may be exposed to at work or at home. These include chemicals, pesticides, radiation, and substances from cat litter boxes. °Any concerns you have for your safety at home. °Mental health °Tell your health care provider about: °Any history of mental health conditions, including feelings of depression, sadness, or anxiety. °Any medicines that you take for a mental health condition. These include herbs and supplements. °How do I know that I am pregnant? °You may be pregnant if you have been sexually active and you miss your period. Other symptoms of early pregnancy include: °Mild cramping. °Very light vaginal bleeding (spotting). °Feeling more tired than usual. °Nausea and vomiting. These may be signs of morning sickness. °Take a home pregnancy test if you have any of these symptoms. This test checks for a hormone in your urine called human chorionic gonadotropin, or hCG. A woman's body begins to make this hormone during early pregnancy. These tests are very accurate. °Wait until at least the first day after you miss your period to take a home pregnancy test. If the test shows that you are pregnant, call your health care provider for a prenatal care visit. °What should I do if I become pregnant? °Schedule a   visit with your health care provider as soon as you suspect you are pregnant. °Talk to your health care provider if you are taking prescription medicines to determine if they are safe to take during  pregnancy. °You may continue to have sex if it does not cause pain or other problems, such as vaginal bleeding. °Follow these instructions at home: °Eating and drinking ° °Follow instructions from your health care provider about eating or drinking restrictions. °Drink enough fluid to keep your urine pale yellow. °Eat a balanced diet. This includes fresh fruits and vegetables, whole grains, lean meats, low-fat dairy products, healthy fats, and foods that are high in fiber. Ask to meet with a nutritionist or registered dietitian for help with meal planning and goals. °Avoid eating raw or undercooked meat and seafood. °Avoid eating or drinking unpasteurized dairy products. °Lifestyle °  °Get regular exercise. Try to be active for at least 30 minutes a day on most days of the week. Ask your health care provider which activities are safe during pregnancy. °Maintain a healthy weight. °Avoid toxic fumes and chemicals. °Avoid cleaning cat litter boxes. Cat feces may contain a harmful parasite called toxoplasma. °Avoid travel to countries where Zika virus is common. °Do not use any products that contain nicotine or tobacco, such as cigarettes, e-cigarettes, and chewing tobacco. If you need help quitting, ask your health care provider. °Do not drink alcohol or use drugs. °General instructions °Keep an accurate record of your menstrual periods. This makes it easier for your health care provider to determine your baby's due date. °Take over-the-counter and prescription medicines only as told by your health care provider. °Begin taking prenatal vitamins and folic acid supplements daily as directed. °Manage any chronic conditions, such as hypertension and diabetes, as told by your health care provider. This is important. °Summary °If you are planning to become pregnant, talk to your health care provider about preconception care. This is an important part of planning for a healthy pregnancy. °Women of childbearing age should take  400 mcg of folic acid daily in addition to eating a diet rich in folic acid. This will prevent certain birth defects. °Schedule a visit with your health care provider as soon as you suspect you are pregnant. Tell your health care provider about your medical history, lifestyle activities, home safety, and other things that may concern you. °This information is not intended to replace advice given to you by your health care provider. Make sure you discuss any questions you have with your health care provider. °Document Revised: 06/18/2019 Document Reviewed: 06/18/2019 °Elsevier Patient Education © 2022 Elsevier Inc. ° °

## 2021-06-17 NOTE — Progress Notes (Signed)
    GYNECOLOGY OFFICE PROCEDURE NOTE  Tammy Contreras is a 25 y.o. [phone] here for Mirena IUD removal.  She has had her IUD in x 2 years, she would like it removed today seeking pregnancy. Has also been noting pelvic cramping, thinks it may be related to her IUD. Last pap smear was 08/16/2018, and was normal.  IUD Removal  Patient identified, informed consent performed, consent signed.  Patient was in the dorsal lithotomy position, normal external genitalia was noted.  A speculum was placed in the patient's vagina, normal discharge was noted, no lesions. The cervix was visualized, no lesions, no abnormal discharge.  The strings of the IUD were grasped and pulled using ring forceps. The IUD was removed in its entirety.  Patient tolerated the procedure well.    Patient plans for pregnancy soon and she was told to avoid teratogens, take PNV and folic acid.  Routine preventative health maintenance measures emphasized.    Hildred Laser, MD [facility]

## 2022-07-17 ENCOUNTER — Ambulatory Visit (INDEPENDENT_AMBULATORY_CARE_PROVIDER_SITE_OTHER): Payer: Medicaid Other

## 2022-07-17 VITALS — BP 120/79 | HR 73 | Wt 163.0 lb

## 2022-07-17 DIAGNOSIS — N926 Irregular menstruation, unspecified: Secondary | ICD-10-CM

## 2022-07-17 DIAGNOSIS — Z3201 Encounter for pregnancy test, result positive: Secondary | ICD-10-CM

## 2022-07-17 DIAGNOSIS — N912 Amenorrhea, unspecified: Secondary | ICD-10-CM

## 2022-07-17 LAB — POCT URINE PREGNANCY: Preg Test, Ur: POSITIVE — AB

## 2022-07-17 NOTE — Progress Notes (Signed)
Subjective:    Tammy Contreras is a 26 y.o. female who presents for evaluation of amenorrhea. She believes she could be pregnant. Pregnancy is desired.  Last period was abnormal. Patient stated her periods are abnormal    Last LMP: 05/31/2022   Lab Review Urine HCG: positive    Assessment:    Absence of menstruation.     Plan:    Pregnancy Test:  Positive: EDC: 03/07/2023. Briefly discussed positive results and sent to check out for scheduling for New OB appointments.

## 2022-07-31 ENCOUNTER — Ambulatory Visit (INDEPENDENT_AMBULATORY_CARE_PROVIDER_SITE_OTHER): Payer: Medicaid Other

## 2022-07-31 VITALS — Wt 163.0 lb

## 2022-07-31 DIAGNOSIS — Z369 Encounter for antenatal screening, unspecified: Secondary | ICD-10-CM

## 2022-07-31 DIAGNOSIS — Z3480 Encounter for supervision of other normal pregnancy, unspecified trimester: Secondary | ICD-10-CM | POA: Insufficient documentation

## 2022-07-31 DIAGNOSIS — Z3689 Encounter for other specified antenatal screening: Secondary | ICD-10-CM

## 2022-07-31 NOTE — Progress Notes (Signed)
Patient has been scheduled for ultrasound 

## 2022-07-31 NOTE — Progress Notes (Signed)
New OB Intake  I connected with  Tammy Contreras on 07/31/22 at  2:15 PM EDT by telephone and verified that I am speaking with the correct person using two identifiers. Nurse is located at [employer] and pt is located at the grocery store.  I explained I am completing New OB Intake today. We discussed her EDD of 03/02/2023 that is based on LMP of 05/26/2022. Pt is G3/P2002. I reviewed her allergies, medications, Medical/Surgical/OB history, and appropriate screenings. Based on history, this is a/an pregnancy uncomplicated .   Patient Active Problem List   Diagnosis Date Noted   History of anemia 02/22/2019   History of asthma 02/22/2019    Concerns addressed today Pt c/o nausea and headaches; adv given per new protocol.  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 Adv that is as long as there is good ventilation and she isn't directly inhaling nitrous she is okay.  As for the x-ray exposure to wear protective gear, limit exposure as much as possible, stand behind barrier, desk duty if possible but not required, radiation dose should be kept below 500 mrem per old protocol.  Delivery Plans:  Plans to deliver at [facility].  Anatomy US Explained first scheduled [place] will be scheduled soon and an anatomy scan will be done at 20 weeks.  Labs Discussed genetic screening with patient. Patient desires genetic testing if ins will cover it; pt aware to call her ins and see; order will be put in but she doesn't have to have it drawn. Discussed possible labs to be drawn at new OB appointment.  COVID Vaccine Patient has had COVID vaccine.   Social Determinants of Health Food Insecurity: denies food insecurity Transportation: Patient denies transportation needs. Childcare: Discussed no children allowed at ultrasound appointments.   First visit review I reviewed new OB appt with pt. I explained she will have ob bloodwork and pap  smear/pelvic exam if indicated. Explained pt will be seen by Dr. Clovia Cuff at first visit; encounter routed to appropriate provider.   Cleophas Dunker, [place] 07/31/2022  2:49 PM

## 2022-08-07 ENCOUNTER — Other Ambulatory Visit: Payer: Medicaid Other

## 2022-08-07 DIAGNOSIS — Z369 Encounter for antenatal screening, unspecified: Secondary | ICD-10-CM

## 2022-08-07 DIAGNOSIS — Z3480 Encounter for supervision of other normal pregnancy, unspecified trimester: Secondary | ICD-10-CM

## 2022-08-14 ENCOUNTER — Other Ambulatory Visit (HOSPITAL_COMMUNITY)
Admission: RE | Admit: 2022-08-14 | Discharge: 2022-08-14 | Disposition: A | Discharge status: Home / Self Care | Payer: Medicaid Other | Source: Ambulatory Visit | Attending: Obstetrics & Gynecology | Admitting: Obstetrics & Gynecology

## 2022-08-14 ENCOUNTER — Ambulatory Visit (INDEPENDENT_AMBULATORY_CARE_PROVIDER_SITE_OTHER): Payer: Medicaid Other | Admitting: Obstetrics & Gynecology

## 2022-08-14 ENCOUNTER — Ambulatory Visit (INDEPENDENT_AMBULATORY_CARE_PROVIDER_SITE_OTHER): Payer: Medicaid Other

## 2022-08-14 ENCOUNTER — Other Ambulatory Visit: Payer: Self-pay | Admitting: Obstetrics & Gynecology

## 2022-08-14 ENCOUNTER — Encounter: Payer: Self-pay | Admitting: Obstetrics & Gynecology

## 2022-08-14 VITALS — BP 90/60 | Wt 165.0 lb

## 2022-08-14 DIAGNOSIS — Z3481 Encounter for supervision of other normal pregnancy, first trimester: Secondary | ICD-10-CM | POA: Diagnosis not present

## 2022-08-14 DIAGNOSIS — Z3480 Encounter for supervision of other normal pregnancy, unspecified trimester: Secondary | ICD-10-CM | POA: Diagnosis not present

## 2022-08-14 DIAGNOSIS — Z3687 Encounter for antenatal screening for uncertain dates: Secondary | ICD-10-CM

## 2022-08-14 DIAGNOSIS — Z369 Encounter for antenatal screening, unspecified: Secondary | ICD-10-CM

## 2022-08-14 DIAGNOSIS — Z3A08 8 weeks gestation of pregnancy: Secondary | ICD-10-CM

## 2022-08-14 DIAGNOSIS — Z124 Encounter for screening for malignant neoplasm of cervix: Secondary | ICD-10-CM | POA: Insufficient documentation

## 2022-08-14 DIAGNOSIS — Z8639 Personal history of other endocrine, nutritional and metabolic disease: Secondary | ICD-10-CM

## 2022-08-14 LAB — CBC/D/PLT+RPR+RH+ABO+RUBIGG...
Antibody Screen: NEGATIVE
Basophils Absolute: 0 10*3/uL (ref 0.0–0.2)
Basos: 1 %
EOS (ABSOLUTE): 0.7 10*3/uL — ABNORMAL HIGH (ref 0.0–0.4)
Eos: 11 %
HCV Ab: NONREACTIVE
HIV Screen 4th Generation wRfx: NONREACTIVE
Hematocrit: 35.9 % (ref 34.0–46.6)
Hemoglobin: 11.9 g/dL (ref 11.1–15.9)
Hepatitis B Surface Ag: NEGATIVE
Immature Grans (Abs): 0 10*3/uL (ref 0.0–0.1)
Immature Granulocytes: 0 %
Lymphocytes Absolute: 1.9 10*3/uL (ref 0.7–3.1)
Lymphs: 29 %
MCH: 30.3 pg (ref 26.6–33.0)
MCHC: 33.1 g/dL (ref 31.5–35.7)
MCV: 91 fL (ref 79–97)
Monocytes Absolute: 0.3 10*3/uL (ref 0.1–0.9)
Monocytes: 4 %
Neutrophils Absolute: 3.5 10*3/uL (ref 1.4–7.0)
Neutrophils: 55 %
Platelets: 233 10*3/uL (ref 150–450)
RBC: 3.93 x10E6/uL (ref 3.77–5.28)
RDW: 12.9 % (ref 11.7–15.4)
RPR Ser Ql: NONREACTIVE
Rh Factor: POSITIVE
Rubella Antibodies, IGG: 1.42 index (ref >0.99)
Varicella zoster IgG: 533 index (ref >165)
WBC: 6.4 10*3/uL (ref 3.4–10.8)

## 2022-08-14 LAB — MATERNIT 21 PLUS CORE, BLOOD
Fetal Fraction: 6
Result (T21): NEGATIVE
Trisomy 13 (Patau syndrome): NEGATIVE
Trisomy 18 (Edwards syndrome): NEGATIVE
Trisomy 21 (Down syndrome): NEGATIVE

## 2022-08-14 LAB — POCT URINALYSIS DIPSTICK OB
Bilirubin, UA: NEGATIVE
Blood, UA: NEGATIVE
Clarity, UA: NEGATIVE
Glucose, UA: NEGATIVE
Ketones, UA: NEGATIVE
Leukocytes, UA: NEGATIVE
Nitrite, UA: NEGATIVE
POC,PROTEIN,UA: NEGATIVE
Spec Grav, UA: 1.01 (ref 1.010–1.025)
Urobilinogen, UA: 0.2 E.U./dL
pH, UA: 6.5 (ref 5.0–8.0)

## 2022-08-14 LAB — HCV INTERPRETATION

## 2022-08-14 NOTE — Addendum Note (Signed)
Addended by: Donnetta Hail on: 08/14/2022 02:49 PM   Modules accepted: Orders

## 2022-08-14 NOTE — Progress Notes (Signed)
  Subjective:    Tammy Contreras a 26 yo G3P2 (3 and 22 yo kids) being seen today for her first obstetrical visit.  This is a planned pregnancy. She is at 8 weeks by her ultrasound today (based on her LMP, she should have been 11 weeks) gestation. Her obstetrical history is significant for nothing. She is unsure about breastfeeding as she has a very prolonged yeast infection of her nipple during the year that she breastfed her youngest daughter. Pregnancy history fully reviewed.  Patient reports no complaints.  Review of Systems:   Review of Systems  Objective:     BP 90/60   Wt 165 lb (74.8 kg)   LMP 05/26/2022 (Exact Date)   BMI 29.23 kg/m  Physical Exam  Exam General:  alert   Breasts:  inspection negative, no nipple discharge or bleeding, no masses or nodularity palpable  Lungs: clear to auscultation bilaterally  Heart:  regular rate and rhythm, S1, S2 normal, no murmur, click, rub or gallop  Abdomen: soft, non-tender; bowel sounds normal; no masses,  no organomegaly   Vulva:  normal  Vagina: normal  Cervix:  No lesions, normal discharge  Corpus: 8 week size  Adnexa:  not enlarged or painful  Rectal Exam: Not performed.       Assessment:    Pregnancy: [id number] Patient Active Problem List   Diagnosis Date Noted   Supervision of other normal pregnancy, antepartum 07/31/2022   History of anemia 02/22/2019   History of asthma 02/22/2019    H/o Vitamin D deficiency   Plan:     Initial labs drawn. Prenatal vitamins. Problem list reviewed and updated. Mat 21 was drawn last week.  Role of ultrasound in pregnancy discussed; fetal survey: ordered. Amniocentesis discussed: not indicated. Follow up in 4 weeks. Anatomy ultrasound ordered for 19 weeks Vit D level to be checked today due to her h/o deficiency  Allie Bossier 08/14/2022

## 2022-08-15 ENCOUNTER — Encounter: Payer: Self-pay | Admitting: Obstetrics & Gynecology

## 2022-08-15 ENCOUNTER — Other Ambulatory Visit: Payer: Self-pay | Admitting: Obstetrics & Gynecology

## 2022-08-15 DIAGNOSIS — O99019 Anemia complicating pregnancy, unspecified trimester: Secondary | ICD-10-CM | POA: Insufficient documentation

## 2022-08-15 DIAGNOSIS — E559 Vitamin D deficiency, unspecified: Secondary | ICD-10-CM | POA: Insufficient documentation

## 2022-08-15 LAB — DRUG SCREEN, URINE
Amphetamines, Urine: NEGATIVE ng/mL
Barbiturate screen, urine: NEGATIVE ng/mL
Benzodiazepine Quant, Ur: NEGATIVE ng/mL
Cannabinoid Quant, Ur: NEGATIVE ng/mL
Cocaine (Metab.): NEGATIVE ng/mL
Opiate Quant, Ur: NEGATIVE ng/mL
PCP Quant, Ur: NEGATIVE ng/mL

## 2022-08-15 LAB — VITAMIN D 25 HYDROXY (VIT D DEFICIENCY, FRACTURES): Vit D, 25-Hydroxy: 16.2 ng/mL — ABNORMAL LOW (ref 30.0–100.0)

## 2022-08-15 MED ORDER — VITAMIN D (ERGOCALCIFEROL) 1.25 MG (50000 UNIT) PO CAPS: 50000.0000 [IU] | ORAL_CAPSULE | ORAL | 0 refills | Status: DC

## 2022-08-15 MED ORDER — FERROUS FUMARATE 325 (106 FE) MG PO TABS: 1.0000 | ORAL_TABLET | Freq: Two times a day (BID) | ORAL | 8 refills | Status: DC

## 2022-08-15 NOTE — Progress Notes (Signed)
Iron prescribed BID

## 2022-08-15 NOTE — Progress Notes (Signed)
Vit d prescribed.

## 2022-08-16 LAB — CERVICOVAGINAL ANCILLARY ONLY
Bacterial Vaginitis (gardnerella): NEGATIVE
Candida Glabrata: NEGATIVE
Candida Vaginitis: POSITIVE — AB
Chlamydia: NEGATIVE
Comment: NEGATIVE
Comment: NEGATIVE
Comment: NEGATIVE
Comment: NEGATIVE
Comment: NEGATIVE
Comment: NORMAL
Neisseria Gonorrhea: NEGATIVE
Trichomonas: NEGATIVE

## 2022-08-16 LAB — URINE CULTURE

## 2022-08-16 LAB — CYTOLOGY - PAP: Diagnosis: NEGATIVE

## 2022-08-21 ENCOUNTER — Encounter: Payer: Self-pay | Admitting: Obstetrics & Gynecology

## 2022-09-11 ENCOUNTER — Ambulatory Visit (INDEPENDENT_AMBULATORY_CARE_PROVIDER_SITE_OTHER): Payer: Self-pay | Admitting: Obstetrics & Gynecology

## 2022-09-11 VITALS — BP 96/65 | HR 75 | Wt 162.5 lb

## 2022-09-11 DIAGNOSIS — Z3A12 12 weeks gestation of pregnancy: Secondary | ICD-10-CM

## 2022-09-11 DIAGNOSIS — O99012 Anemia complicating pregnancy, second trimester: Secondary | ICD-10-CM

## 2022-09-11 DIAGNOSIS — E559 Vitamin D deficiency, unspecified: Secondary | ICD-10-CM

## 2022-09-11 LAB — POCT URINALYSIS DIPSTICK OB
Bilirubin, UA: NEGATIVE
Blood, UA: NEGATIVE
Glucose, UA: NEGATIVE
Ketones, UA: NEGATIVE
Leukocytes, UA: NEGATIVE
Nitrite, UA: NEGATIVE
Spec Grav, UA: 1.01 (ref 1.010–1.025)
Urobilinogen, UA: 0.2 E.U./dL
pH, UA: 7.5 (ref 5.0–8.0)

## 2022-09-11 NOTE — Progress Notes (Signed)
   PRENATAL VISIT NOTE  Subjective:  Tammy Contreras is a 26 y.o. G3P2002 at [redacted]w[redacted]d being seen today for ongoing prenatal care.  She is currently monitored for the following issues for this low-risk pregnancy and has History of anemia; History of asthma; Supervision of other normal pregnancy, antepartum; Vitamin D deficiency; and Anemia in pregnancy on their problem list.  Patient reports no complaints.  Contractions: Not present. Vag. Bleeding: None.  Movement: Absent. Denies leaking of fluid.   The following portions of the patient's history were reviewed and updated as appropriate: allergies, current medications, past family history, past medical history, past social history, past surgical history and problem list.   Objective:   Vitals:   09/11/22 1344  BP: 96/65  Pulse: 75  Weight: 162 lb 8 oz (73.7 kg)    Fetal Status:     Movement: Absent     General:  Alert, oriented and cooperative. Patient is in no acute distress.  Skin: Skin is warm and dry. No rash noted.   Cardiovascular: Normal heart rate noted  Respiratory: Normal respiratory effort, no problems with respiration noted  Abdomen: Soft, gravid, appropriate for gestational age.  Pain/Pressure: Absent     Pelvic:       deferred  Extremities: Normal range of motion.     Mental Status: Normal mood and affect. Normal behavior. Normal judgment and thought content.   FHR 166 Assessment and Plan:  Pregnancy: G3P2002 at [redacted]w[redacted]d 1. [redacted] weeks gestation of pregnancy  - POC Urinalysis Dipstick OB - US OB Comp + 14 Wk; Future - Mat 21 results as normal (she doesn't want to know the gender at the present)  2. Vitamin D deficiency (16) - supplement prescribed  3. Anemia during pregnancy in second trimester (9.8) - iron prescribed  Preterm labor symptoms and general obstetric precautions including but not limited to vaginal bleeding, contractions, leaking of fluid and fetal movement were reviewed in detail with the  patient. Please refer to After Visit Summary for other counseling recommendations.   Return in about 7 weeks (around 10/30/2022).  Future Appointments  Date Time Provider Department Center  10/30/2022  1:00 PM [facility] [place] 1 [facility] None    Allie Bossier, MD

## 2022-09-19 ENCOUNTER — Telehealth: Payer: Self-pay | Admitting: Obstetrics & Gynecology

## 2022-09-19 NOTE — Telephone Encounter (Signed)
Patient called and states that she was told by Dr. Marice Potter to call to schedule an appointment when the rash appeared on her breast again. Patient was states she desires to be seen before rash goes away again. Patient states she is not available this week but will be available the week of Dec 25th. No openings please advise on where to place patient.

## 2022-09-29 ENCOUNTER — Encounter: Payer: Self-pay | Admitting: Advanced Practice Midwife

## 2022-09-29 ENCOUNTER — Telehealth: Payer: Self-pay

## 2022-09-29 NOTE — Telephone Encounter (Signed)
Patient calling in to state her rash on her breast has returned. She last saw Dr. Marice Potter who advised her to come in the office when it returned for further evaluation but there are currently no openings. Patient is asking for a referral to dermatology. Please advise if appropriate.

## 2022-10-30 ENCOUNTER — Encounter: Payer: Medicaid Other | Admitting: Obstetrics & Gynecology

## 2022-10-30 ENCOUNTER — Other Ambulatory Visit: Payer: Self-pay

## 2022-11-09 ENCOUNTER — Ambulatory Visit
Admission: RE | Admit: 2022-11-09 | Discharge: 2022-11-09 | Disposition: A | Discharge status: Home / Self Care | Payer: Medicaid Other | Source: Ambulatory Visit | Attending: Obstetrics & Gynecology | Admitting: Obstetrics & Gynecology

## 2022-11-09 DIAGNOSIS — Z3A21 21 weeks gestation of pregnancy: Secondary | ICD-10-CM | POA: Insufficient documentation

## 2022-11-09 DIAGNOSIS — O321XX0 Maternal care for breech presentation, not applicable or unspecified: Secondary | ICD-10-CM | POA: Diagnosis not present

## 2022-11-09 DIAGNOSIS — Z3A12 12 weeks gestation of pregnancy: Secondary | ICD-10-CM

## 2022-11-10 ENCOUNTER — Ambulatory Visit (INDEPENDENT_AMBULATORY_CARE_PROVIDER_SITE_OTHER): Payer: Medicaid Other | Admitting: Certified Nurse Midwife

## 2022-11-10 ENCOUNTER — Encounter: Payer: Self-pay | Admitting: Certified Nurse Midwife

## 2022-11-10 VITALS — BP 105/65 | HR 84 | Wt 163.5 lb

## 2022-11-10 DIAGNOSIS — Z3482 Encounter for supervision of other normal pregnancy, second trimester: Secondary | ICD-10-CM

## 2022-11-10 DIAGNOSIS — Z3A21 21 weeks gestation of pregnancy: Secondary | ICD-10-CM

## 2022-11-10 LAB — POCT URINALYSIS DIPSTICK OB
Bilirubin, UA: NEGATIVE
Glucose, UA: NEGATIVE
Ketones, UA: NEGATIVE
Nitrite, UA: NEGATIVE
POC,PROTEIN,UA: NEGATIVE
Spec Grav, UA: 1.015 (ref 1.010–1.025)
Urobilinogen, UA: 0.2 E.U./dL
pH, UA: 6.5 (ref 5.0–8.0)

## 2022-11-10 NOTE — Patient Instructions (Signed)
Round Ligament Pain  The round ligaments are a pair of cord-like tissues that help support the uterus. They can become a source of pain during pregnancy as the ligaments soften and stretch as the baby grows. The pain usually begins in the second trimester (13-28 weeks) of pregnancy, and should only last for a few seconds when it occurs. However, the pain can come and go until the baby is delivered. The pain does not cause harm to the baby. Round ligament pain is usually a short, sharp, and pinching pain, but it can also be a dull, lingering, and aching pain. The pain is felt in the lower side of the abdomen or in the groin. It usually starts deep in the groin and moves up to the outside of the hip area. The pain may happen when you: Suddenly change position, such as quickly going from a sitting to standing position. Do physical activity. Cough or sneeze. Follow these instructions at home: Managing pain  When the pain starts, relax. Then, try any of these methods to help with the pain: Sit down. Flex your knees up to your abdomen. Lie on your side with one pillow under your abdomen and another pillow between your legs. Sit in a warm bath for 15-20 minutes or until the pain goes away. General instructions Watch your condition for any changes. Move slowly when you sit down or stand up. Stop or reduce your physical activities if they cause pain. Avoid long walks if they cause pain. Take over-the-counter and prescription medicines only as told by your health care provider. Keep all follow-up visits. This is important. Contact a health care provider if: Your pain does not go away with treatment. You feel pain in your back that you did not have before. Your medicine is not helping. You have a fever or chills. You have nausea or vomiting. You have diarrhea. You have pain when you urinate. Get help right away if: You have pain that is a rhythmic, cramping pain similar to labor pains. Labor  pains are usually 2 minutes apart, last for about 1 minute, and involve a bearing down feeling or pressure in your pelvis. You have vaginal bleeding. These symptoms may represent a serious problem that is an emergency. Do not wait to see if the symptoms will go away. Get medical help right away. Call your local emergency services (911 in the [place] Do not drive yourself to the hospital. Summary Round ligament pain is felt in the lower abdomen or groin. This pain usually begins in the second trimester (13-28 weeks) and should only last for a few seconds when it occurs. You may notice the pain when you suddenly change position, when you cough or sneeze, or during physical activity. Relaxing, flexing your knees to your abdomen, lying on one side, or taking a warm bath may help to get rid of the pain. Contact your health care provider if the pain does not go away. This information is not intended to replace advice given to you by your health care provider. Make sure you discuss any questions you have with your health care provider. Document Revised: 12/01/2020 Document Reviewed: 12/01/2020 Elsevier Patient Education  2023 Elsevier Inc.  

## 2022-11-10 NOTE — Progress Notes (Signed)
Body mass index is 28.96 kg/m.

## 2022-11-10 NOTE — Progress Notes (Addendum)
ROB doing well, just starting to feel fetal movement. Reassurance given. U/s for anatomy completed yesterday. Results reviewed. Pt sate she has eczema that affects her nipples, she had this diagnosed with her last pregnancy . She is starting to have some flair ups and has been using triamcinolone cream that was prescribed to treat it in the past. Discussed using in sparingly during pregnancy. She verbalizes and agrees. Pt also notes that she had yeast infection and had been using monistat over the counter to treat. She feels like it has not improved. Discussed using diflucan. Pt request to try another round of monistat first. Follow up [place] in 4 wks.   Philip Aspen, CNM       Narrative & Impression  CLINICAL DATA:  Fetal anatomic survey   EXAM: OBSTETRICAL ULTRASOUND >14 WKS   FINDINGS: Number of Fetuses: 1   Heart Rate:  141 bpm   Movement: Yes   Presentation: Breech   Previa: No   Placental Location: Fundal   Amniotic Fluid (Subjective): Normal   Amniotic Fluid (Objective):   Vertical pocket = 5.3cm   FETAL BIOMETRY   BPD: 4.8cm 20w 4d   HC:   19.4cm 21w 4d   AC:   17.7cm 22w 4d   FL:   3.5cm 21w 0d   Current Mean GA: 21w 1d [place] EDC: 03/21/2023   Assigned GA:  21w 2d Assigned EDC: 03/20/2023   FETAL ANATOMY   Lateral Ventricles: Appears normal   Thalami/CSP: Appears normal   Posterior Fossa:  Appears normal   Nuchal Region: Appears normal   NFT= Not Visualized.   Upper Lip: Appears normal   Spine: Appears normal   4 Chamber Heart on Left: Appears normal   LVOT: Appears normal   RVOT: Appears normal   Stomach on Left: Appears normal   3 Vessel Cord: Appears normal   Cord Insertion site: Appears normal   Kidneys: Appears normal   Bladder: Appears normal   Extremities: Appears normal   Sex: Female   Technically difficult due to: None   Maternal Findings:   Cervix:  3.6 cm   IMPRESSION: 1. Single live intrauterine pregnancy as above  estimated age 30 weeks and 1 day. 2. Normal fetal anatomic survey.     Electronically Signed   By: Diana Eves.D.

## 2022-12-08 ENCOUNTER — Other Ambulatory Visit (HOSPITAL_COMMUNITY)
Admission: RE | Admit: 2022-12-08 | Discharge: 2022-12-08 | Disposition: A | Discharge status: Home / Self Care | Payer: Medicaid Other | Source: Ambulatory Visit | Attending: Obstetrics and Gynecology | Admitting: Obstetrics and Gynecology

## 2022-12-08 ENCOUNTER — Encounter: Payer: Self-pay | Admitting: Obstetrics and Gynecology

## 2022-12-08 ENCOUNTER — Ambulatory Visit (INDEPENDENT_AMBULATORY_CARE_PROVIDER_SITE_OTHER): Payer: Medicaid Other | Admitting: Obstetrics and Gynecology

## 2022-12-08 VITALS — BP 94/58 | HR 85 | Wt 170.0 lb

## 2022-12-08 DIAGNOSIS — B3731 Acute candidiasis of vulva and vagina: Secondary | ICD-10-CM | POA: Diagnosis present

## 2022-12-08 DIAGNOSIS — O98813 Other maternal infectious and parasitic diseases complicating pregnancy, third trimester: Secondary | ICD-10-CM

## 2022-12-08 DIAGNOSIS — Z3A25 25 weeks gestation of pregnancy: Secondary | ICD-10-CM

## 2022-12-08 DIAGNOSIS — Z3480 Encounter for supervision of other normal pregnancy, unspecified trimester: Secondary | ICD-10-CM

## 2022-12-08 LAB — POCT URINALYSIS DIPSTICK OB
Bilirubin, UA: NEGATIVE
Blood, UA: NEGATIVE
Glucose, UA: NEGATIVE
Ketones, UA: NEGATIVE
Leukocytes, UA: NEGATIVE
Nitrite, UA: NEGATIVE
Spec Grav, UA: 1.02 (ref 1.010–1.025)
Urobilinogen, UA: 0.2 E.U./dL
pH, UA: 6.5 (ref 5.0–8.0)

## 2022-12-08 NOTE — Progress Notes (Signed)
ROB [redacted]w[redacted]d She is doing well. She has good fetal movement. She would like to do a TOC to make sure yeast infection is gone.

## 2022-12-08 NOTE — Progress Notes (Signed)
ROB: patient is a 27 y.o. G3P2002 at 24w3dwho presents for routine care. Does note some soreness on her right side lately.  Also desires to be rechecked for yeast infection, has attempted treatment twice so far this pregnancy with OTC Monistat.  Notes issues with recurrent yeast infections during her last pregnancy. Advised on probiotics, consumption of plain yogurt or use of suppository. Self-swab collected. RTC in 4 weeks, for 28 week labs at that time.

## 2022-12-12 LAB — CERVICOVAGINAL ANCILLARY ONLY
Bacterial Vaginitis (gardnerella): NEGATIVE
Candida Glabrata: NEGATIVE
Candida Vaginitis: NEGATIVE
Comment: NEGATIVE
Comment: NEGATIVE
Comment: NEGATIVE

## 2022-12-20 ENCOUNTER — Observation Stay
Admission: EM | Admit: 2022-12-20 | Discharge: 2022-12-20 | Disposition: A | Discharge status: Home / Self Care | Payer: Medicaid Other | Attending: Obstetrics | Admitting: Obstetrics

## 2022-12-20 ENCOUNTER — Encounter: Payer: Self-pay | Admitting: Obstetrics and Gynecology

## 2022-12-20 ENCOUNTER — Telehealth: Payer: Self-pay

## 2022-12-20 ENCOUNTER — Other Ambulatory Visit: Payer: Self-pay

## 2022-12-20 DIAGNOSIS — R1030 Lower abdominal pain, unspecified: Secondary | ICD-10-CM | POA: Insufficient documentation

## 2022-12-20 DIAGNOSIS — O26892 Other specified pregnancy related conditions, second trimester: Principal | ICD-10-CM | POA: Insufficient documentation

## 2022-12-20 DIAGNOSIS — R109 Unspecified abdominal pain: Secondary | ICD-10-CM | POA: Diagnosis not present

## 2022-12-20 DIAGNOSIS — Z3A27 27 weeks gestation of pregnancy: Secondary | ICD-10-CM | POA: Diagnosis not present

## 2022-12-20 DIAGNOSIS — O26899 Other specified pregnancy related conditions, unspecified trimester: Secondary | ICD-10-CM | POA: Diagnosis present

## 2022-12-20 LAB — URINALYSIS, ROUTINE W REFLEX MICROSCOPIC
Bilirubin Urine: NEGATIVE
Glucose, UA: NEGATIVE mg/dL
Hgb urine dipstick: NEGATIVE
Ketones, ur: NEGATIVE mg/dL
Nitrite: NEGATIVE
Protein, ur: NEGATIVE mg/dL
Specific Gravity, Urine: 1.01 (ref 1.005–1.030)
pH: 6 (ref 5.0–8.0)

## 2022-12-20 LAB — CBC WITH DIFFERENTIAL/PLATELET
Abs Immature Granulocytes: 0.02 10*3/uL (ref 0.00–0.07)
Basophils Absolute: 0 10*3/uL (ref 0.0–0.1)
Basophils Relative: 0 %
Eosinophils Absolute: 0.3 10*3/uL (ref 0.0–0.5)
Eosinophils Relative: 4 %
HCT: 28.7 % — ABNORMAL LOW (ref 36.0–46.0)
Hemoglobin: 9.6 g/dL — ABNORMAL LOW (ref 12.0–15.0)
Immature Granulocytes: 0 %
Lymphocytes Relative: 28 %
Lymphs Abs: 1.9 10*3/uL (ref 0.7–4.0)
MCH: 31.8 pg (ref 26.0–34.0)
MCHC: 33.4 g/dL (ref 30.0–36.0)
MCV: 95 fL (ref 80.0–100.0)
Monocytes Absolute: 0.5 10*3/uL (ref 0.1–1.0)
Monocytes Relative: 8 %
Neutro Abs: 4 10*3/uL (ref 1.7–7.7)
Neutrophils Relative %: 60 %
Platelets: 220 10*3/uL (ref 150–400)
RBC: 3.02 MIL/uL — ABNORMAL LOW (ref 3.87–5.11)
RDW: 13.2 % (ref 11.5–15.5)
WBC: 6.8 10*3/uL (ref 4.0–10.5)
nRBC: 0 % (ref 0.0–0.2)

## 2022-12-20 LAB — COMPREHENSIVE METABOLIC PANEL
ALT: 15 U/L (ref 0–44)
AST: 18 U/L (ref 15–41)
Albumin: 3 g/dL — ABNORMAL LOW (ref 3.5–5.0)
Alkaline Phosphatase: 80 U/L (ref 38–126)
Anion gap: 11 (ref 5–15)
BUN: 9 mg/dL (ref 6–20)
CO2: 21 mmol/L — ABNORMAL LOW (ref 22–32)
Calcium: 9.2 mg/dL (ref 8.9–10.3)
Chloride: 106 mmol/L (ref 98–111)
Creatinine, Ser: 0.45 mg/dL (ref 0.44–1.00)
GFR, Estimated: >60 mL/min (ref >60)
Glucose, Bld: 113 mg/dL — ABNORMAL HIGH (ref 70–99)
Potassium: 4 mmol/L (ref 3.5–5.1)
Sodium: 138 mmol/L (ref 135–145)
Total Bilirubin: 0.8 mg/dL (ref 0.3–1.2)
Total Protein: 6.5 g/dL (ref 6.5–8.1)

## 2022-12-20 LAB — LIPASE, BLOOD: Lipase: 33 U/L (ref 11–51)

## 2022-12-20 LAB — AMYLASE: Amylase: 52 U/L (ref 28–100)

## 2022-12-20 MED ORDER — CYCLOBENZAPRINE HCL 5 MG PO TABS
5.0000 mg | ORAL_TABLET | Freq: Once | ORAL | Status: AC
  Administered 2022-12-20: 5 mg via ORAL
  Filled 2022-12-20: qty 1

## 2022-12-20 MED ORDER — ACETAMINOPHEN 325 MG PO TABS
650.0000 mg | ORAL_TABLET | ORAL | Status: DC | PRN
  Administered 2022-12-20: 650 mg via ORAL
  Filled 2022-12-20: qty 2

## 2022-12-20 NOTE — Discharge Summary (Signed)
Please see Final Progress Note.  Imagene Riches, CNM  12/20/2022 9:18 PM

## 2022-12-20 NOTE — OB Triage Note (Signed)
G3P2 presents to L&D triage at [redacted]w[redacted]d w/ c/o lower right abdominal pain. Pt reports that she's had this pain for 3 weeks, but it's worsened since Saturday/Sunday (3/16 + 3/17). Pt describes the pain as sharp and reports walking and palpation aggravate the pain. She currently reports pain as a 7/10. Pt denies lofm, vaginal bleeding, ctx's, viral symptoms. Pt admits diarrhea for 2 days but denies vomiting. Rolly Salter CNM notified. Monitors applied, currently assessing.

## 2022-12-20 NOTE — Telephone Encounter (Signed)
Pt calling; having abdominal pain; is 27wks; should she go to the hosp?  [phone]  Pt states has been having lower right abdominal pain for the past 2-3 weeks; is way worse now as it hurts to stand up, sit down, twist, walk, stand.  The only heavy lifting pt has done is taking her sleeping daughter to bed.  Adv to go to L&D if in that much pain and too late to be seen here today.  Aracelly notified.

## 2022-12-20 NOTE — Final Progress Note (Signed)
Final Progress Note  Patient ID: Tammy Contreras MRN: [id number] DOB/AGE: 27/14/1997 27 y.o.  Admit date: 12/20/2022 Admitting provider: Rubie Maid, MD Discharge date: 12/20/2022   Admission Diagnoses: lower abdominal pain in pregnancy [redacted] weeks gestation  Discharge Diagnoses:  Principal Problem:   Abdominal pain in pregnancy, antepartum  Round ligament pain in pregnancy Reassuring fetal heart tones.  History of Present Illness: The patient is a 27 y.o. female G3P2002 at [redacted]w[redacted]d who presents for evaluation of some right sided pain that she has had for two weeks. She reports that recently, it has become stronger. 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 Her baby is moving well. She denies any vaginal bleeding or leaking of fluid. She has not tried any Tylenol, or self measures to address the discomfort. She appears calm and not in distress throughout the triage visit. She had borrowed a maternity belt from a relative, but did not think that this helped at all. She shares that she "doesn't like to take medication" in general, so has not tried Acetaminophen.  Past Medical History:  Diagnosis Date   Anemia     Past Surgical History:  Procedure Laterality Date   WISDOM TOOTH EXTRACTION      No current facility-administered medications on file prior to encounter.   Current Outpatient Medications on File Prior to Encounter  Medication Sig Dispense Refill   Prenatal Vit-Fe Fumarate-FA (MULTIVITAMIN-PRENATAL) 27-0.8 MG TABS tablet Take 2 tablets by mouth daily at 12 noon.      No Known Allergies  Social History   Socioeconomic History   Marital status: Married    Spouse name: Pamella Pert   Number of children: 2   Years of education: 14   Highest education level: Not on file  Occupational History   Occupation: Art therapist  Tobacco Use   Smoking status: Never   Smokeless tobacco: Never  Vaping Use   Vaping Use: Never used   Substance and Sexual Activity   Alcohol use: Not Currently   Drug use: Never   Sexual activity: Yes    Partners: Male    Birth control/protection: None  Other Topics Concern   Not on file  Social History Narrative   ** Merged History Encounter **       Social Determinants of Health   Financial Resource Strain: Low Risk  (07/31/2022)   Overall Financial Resource Strain (CARDIA)    Difficulty of Paying Living Expenses: Not very hard  Food Insecurity: No Food Insecurity (07/31/2022)   Hunger Vital Sign    Worried About Running Out of Food in the Last Year: Never true    Ran Out of Food in the Last Year: Never true  Transportation Needs: No Transportation Needs (07/31/2022)   PRAPARE - Hydrologist (Medical): No    Lack of Transportation (Non-Medical): No  Physical Activity: Inactive (07/31/2022)   Exercise Vital Sign    Days of Exercise per Week: 0 days    Minutes of Exercise per Session: 0 min  Stress: No Stress Concern Present (07/31/2022)   [place]    Feeling of Stress : Not at all  Social Connections: Moderately Integrated (07/31/2022)   Social Connection and Isolation Panel [NHANES]    Frequency of Communication with Friends and Family: More than three times a week    Frequency of Social Gatherings with Friends and Family: Twice a week  Attends Religious Services: More than 4 times per year    Active Member of Clubs or Organizations: No    Attends Archivist Meetings: Never    Marital Status: Married  Human resources officer Violence: Not At Risk (07/31/2022)   Humiliation, Afraid, Rape, and Kick questionnaire    Fear of Current or Ex-Partner: No    Emotionally Abused: No    Physically Abused: No    Sexually Abused: No    Family History  Problem Relation Age of Onset   Hypertension Mother        borderline   Diabetes Mother        Borderline   Hyperlipidemia  Mother        borderline   Healthy Father    Healthy Sister    Asthma Brother    Diabetes Maternal Grandfather    Hypertension Maternal Grandfather    Healthy Paternal Grandmother    Hyperlipidemia Paternal Grandfather    Diabetes Paternal Grandfather    Hypertension Paternal Grandfather    Cataracts Paternal Grandfather    Breast cancer Neg Hx    Ovarian cancer Neg Hx    Colon cancer Neg Hx      Review of Systems  Constitutional: Negative.   HENT: Negative.    Eyes: Negative.   Respiratory: Negative.    Cardiovascular: Negative.   Gastrointestinal: Negative.   Genitourinary: Negative.   Musculoskeletal:        Some right sided "pulling" pain  Skin:        Has numerous stretch marks on her abdomen that bother her. Her skin is sensitive.  Neurological: Negative.   Endo/Heme/Allergies: Negative.   Psychiatric/Behavioral: Negative.       Physical Exam: BP 118/69 (BP Location: Left Arm)   Pulse 84   Temp 98.2 F (36.8 C) (Oral)   Resp 16   LMP 05/26/2022 (Exact Date)   Physical Exam Constitutional:      Appearance: Normal appearance.     Comments: Appears very calm and in NAD  Genitourinary:     Genitourinary Comments: Exam deferred  HENT:     Head: Normocephalic and atraumatic.  Cardiovascular:     Rate and Rhythm: Normal rate and regular rhythm.     Pulses: Normal pulses.     Heart sounds: Normal heart sounds.  Pulmonary:     Effort: Pulmonary effort is normal.     Breath sounds: Normal breath sounds.  Abdominal:     Comments: Gravid abdomen. Palpates soft.  Musculoskeletal:     Cervical back: Normal range of motion and neck supple.     Comments: Some tenderness along her right side.  Neurological:     General: No focal deficit present.     Mental Status: She is alert and oriented to person, place, and time.  Skin:    General: Skin is warm and dry.  Psychiatric:        Mood and Affect: Mood normal.        Behavior: Behavior normal.     Consults:  None  Significant Findings/ Diagnostic Studies: labs:  Results for orders placed or performed during the hospital encounter of 12/20/22 (from the past 24 hour(s))  Urinalysis, Routine w reflex microscopic -Urine, Clean Catch     Status: Abnormal   Collection Time: 12/20/22  6:36 PM  Result Value Ref Range   Color, Urine YELLOW (A) YELLOW   APPearance HAZY (A) CLEAR   Specific Gravity, Urine 1.010 1.005 - 1.030  pH 6.0 5.0 - 8.0   Glucose, UA NEGATIVE NEGATIVE mg/dL   Hgb urine dipstick NEGATIVE NEGATIVE   Bilirubin Urine NEGATIVE NEGATIVE   Ketones, ur NEGATIVE NEGATIVE mg/dL   Protein, ur NEGATIVE NEGATIVE mg/dL   Nitrite NEGATIVE NEGATIVE   Leukocytes,Ua SMALL (A) NEGATIVE   RBC / HPF 0-5 0 - 5 RBC/hpf   WBC, UA 0-5 0 - 5 WBC/hpf   Bacteria, UA RARE (A) NONE SEEN   Squamous Epithelial / HPF 0-5 0 - 5 /HPF   Mucus PRESENT   Comprehensive metabolic panel     Status: Abnormal   Collection Time: 12/20/22  7:51 PM  Result Value Ref Range   Sodium 138 135 - 145 mmol/L   Potassium 4.0 3.5 - 5.1 mmol/L   Chloride 106 98 - 111 mmol/L   CO2 21 (L) 22 - 32 mmol/L   Glucose, Bld 113 (H) 70 - 99 mg/dL   BUN 9 6 - 20 mg/dL   Creatinine, Ser 0.45 0.44 - 1.00 mg/dL   Calcium 9.2 8.9 - 10.3 mg/dL   Total Protein 6.5 6.5 - 8.1 g/dL   Albumin 3.0 (L) 3.5 - 5.0 g/dL   AST 18 15 - 41 U/L   ALT 15 0 - 44 U/L   Alkaline Phosphatase 80 38 - 126 U/L   Total Bilirubin 0.8 0.3 - 1.2 mg/dL   GFR, Estimated >60 >60 mL/min   Anion gap 11 5 - 15  CBC with Differential/Platelet     Status: Abnormal   Collection Time: 12/20/22  7:51 PM  Result Value Ref Range   WBC 6.8 4.0 - 10.5 K/uL   RBC 3.02 (L) 3.87 - 5.11 MIL/uL   Hemoglobin 9.6 (L) 12.0 - 15.0 g/dL   HCT 28.7 (L) 36.0 - 46.0 %   MCV 95.0 80.0 - 100.0 fL   MCH 31.8 26.0 - 34.0 pg   MCHC 33.4 30.0 - 36.0 g/dL   RDW 13.2 11.5 - 15.5 %   Platelets 220 150 - 400 K/uL   nRBC 0.0 0.0 - 0.2 %   Neutrophils Relative % 60 %   Neutro Abs 4.0  1.7 - 7.7 K/uL   Lymphocytes Relative 28 %   Lymphs Abs 1.9 0.7 - 4.0 K/uL   Monocytes Relative 8 %   Monocytes Absolute 0.5 0.1 - 1.0 K/uL   Eosinophils Relative 4 %   Eosinophils Absolute 0.3 0.0 - 0.5 K/uL   Basophils Relative 0 %   Basophils Absolute 0.0 0.0 - 0.1 K/uL   Immature Granulocytes 0 %   Abs Immature Granulocytes 0.02 0.00 - 0.07 K/uL  Lipase, blood     Status: None   Collection Time: 12/20/22  7:51 PM  Result Value Ref Range   Lipase 33 11 - 51 U/L  Amylase     Status: None   Collection Time: 12/20/22  7:51 PM  Result Value Ref Range   Amylase 52 28 - 100 U/L     Procedures: EFM NST Baseline FHR: 150 beats/min Tocometry: some irregular, infrequent contractions noted  Interpretation:  INDICATIONS: to check fetal heart tones, Pregnancy is 27 weeks; NST not determined for this prematurity Hospital Course: The patient was admitted to Labor and Delivery Triage for observation. Fetal heart tones were reassuring. A CBC, CMP and Lipase and Amylase were ordered for her, and those results were WNL except for her lower H and H. She was given some Tylenol and oral hydration. After a period of time, she  was found to be sleeping in the triage room. With reassuring FHTs and essentially normal lab work, she is discharged home after taking one Flexeril 5 mg, and with instructions to  purchase a maternity belt for her measurements, and to try regular dosing of Tylenol, especially at work. She is also encouraged to use heat or cold when uncomfortable; either by applying a cold pack to the area, or taking a hot bath. She will follow up at [facility].  Discharge Condition: good  Disposition: Discharge disposition: 01-Home or Self Care       Diet: Regular diet, increase iron bearing foods  Discharge Activity: Activity as tolerated  Discharge Instructions     Notify physician for a general feeling that "something is not right"   Complete by: As directed    Notify physician for  increase or change in vaginal discharge   Complete by: As directed    Notify physician for intestinal cramps, with or without diarrhea, sometimes described as "gas pain"   Complete by: As directed    Notify physician for leaking of fluid   Complete by: As directed    Notify physician for low, dull backache, unrelieved by heat or Tylenol   Complete by: As directed    Notify physician for menstrual like cramps   Complete by: As directed    Notify physician for pelvic pressure   Complete by: As directed    Notify physician for uterine contractions.  These may be painless and feel like the uterus is tightening or the baby is  "balling up"   Complete by: As directed    Notify physician for vaginal bleeding   Complete by: As directed    PRETERM LABOR:  Includes any of the follwing symptoms that occur between 20 - [redacted] weeks gestation.  If these symptoms are not stopped, preterm labor can result in preterm delivery, placing your baby at risk   Complete by: As directed       Allergies as of 12/20/2022   No Known Allergies      Medication List     TAKE these medications    multivitamin-prenatal 27-0.8 MG Tabs tablet Take 2 tablets by mouth daily at 12 noon.         Total time spent taking care of this patient: 45 minutes  Signed: Imagene Riches, CNM  12/20/2022, 9:23 PM

## 2023-01-08 ENCOUNTER — Other Ambulatory Visit: Payer: Medicaid Other

## 2023-01-08 ENCOUNTER — Encounter: Payer: Medicaid Other | Admitting: Licensed Practical Nurse

## 2023-01-10 NOTE — Patient Instructions (Addendum)
Tdap (Tetanus, Diphtheria, Pertussis) Vaccine: What You Need to Know 1. Why get vaccinated? Tdap vaccine can prevent tetanus, diphtheria, and pertussis. Diphtheria and pertussis spread from person to person. Tetanus enters the body through cuts or wounds. TETANUS (T) causes painful stiffening of the muscles. Tetanus can lead to serious health problems, including being unable to open the mouth, having trouble swallowing and breathing, or death. DIPHTHERIA (D) can lead to difficulty breathing, heart failure, paralysis, or death. PERTUSSIS (aP), also known as "whooping cough," can cause uncontrollable, violent coughing that makes it hard to breathe, eat, or drink. Pertussis can be extremely serious especially in babies and young children, causing pneumonia, convulsions, brain damage, or death. In teens and adults, it can cause weight loss, loss of bladder control, passing out, and rib fractures from severe coughing. 2. Tdap vaccine Tdap is only for children 7 years and older, adolescents, and adults.  Adolescents should receive a single dose of Tdap, preferably at age 11 or 12 years. Pregnant people should get a dose of Tdap during every pregnancy, preferably during the early part of the third trimester, to help protect the newborn from pertussis. Infants are most at risk for severe, life-threatening complications from pertussis. Adults who have never received Tdap should get a dose of Tdap. Also, adults should receive a booster dose of either Tdap or Td (a different vaccine that protects against tetanus and diphtheria but not pertussis) every 10 years, or after 5 years in the case of a severe or dirty wound or burn. Tdap may be given at the same time as other vaccines. 3. Talk with your health care provider Tell your vaccine provider if the person getting the vaccine: Has had an allergic reaction after a previous dose of any vaccine that protects against tetanus, diphtheria, or pertussis, or has any  severe, life-threatening allergies Has had a coma, decreased level of consciousness, or prolonged seizures within 7 days after a previous dose of any pertussis vaccine (DTP, DTaP, or Tdap) Has seizures or another nervous system problem Has ever had Guillain-Barr Syndrome (also called "GBS") Has had severe pain or swelling after a previous dose of any vaccine that protects against tetanus or diphtheria In some cases, your health care provider may decide to postpone Tdap vaccination until a future visit. People with minor illnesses, such as a cold, may be vaccinated. People who are moderately or severely ill should usually wait until they recover before getting Tdap vaccine.  Your health care provider can give you more information. 4. Risks of a vaccine reaction Pain, redness, or swelling where the shot was given, mild fever, headache, feeling tired, and nausea, vomiting, diarrhea, or stomachache sometimes happen after Tdap vaccination. People sometimes faint after medical procedures, including vaccination. Tell your provider if you feel dizzy or have vision changes or ringing in the ears.  As with any medicine, there is a very remote chance of a vaccine causing a severe allergic reaction, other serious injury, or death. 5. What if there is a serious problem? An allergic reaction could occur after the vaccinated person leaves the clinic. If you see signs of a severe allergic reaction (hives, swelling of the face and throat, difficulty breathing, a fast heartbeat, dizziness, or weakness), call 9-1-1 and get the person to the nearest hospital. For other signs that concern you, call your health care provider.  Adverse reactions should be reported to the Vaccine Adverse Event Reporting System (VAERS). Your health care provider will usually file this report, or you   can do it yourself. Visit the VAERS website at www.vaers.hhs.gov or call 1-800-822-7967. VAERS is only for reporting reactions, and VAERS staff  members do not give medical advice. 6. The National Vaccine Injury Compensation Program The National Vaccine Injury Compensation Program (VICP) is a federal program that was created to compensate people who may have been injured by certain vaccines. Claims regarding alleged injury or death due to vaccination have a time limit for filing, which may be as short as two years. Visit the VICP website at www.hrsa.gov/vaccinecompensation or call 1-800-338-2382 to learn about the program and about filing a claim. 7. How can I learn more? Ask your health care provider. Call your local or state health department. Visit the website of the Food and Drug Administration (FDA) for vaccine package inserts and additional information at www.fda.gov/vaccines-blood-biologics/vaccines. Contact the Centers for Disease Control and Prevention (CDC): Call 1-800-232-4636 (1-800-CDC-INFO) or Visit CDC's website at www.cdc.gov/vaccines. Source: CDC Vaccine Information Statement Tdap (Tetanus, Diphtheria, Pertussis) Vaccine (05/07/2020) This same material is available at www.cdc.gov for no charge. This information is not intended to replace advice given to you by your health care provider. Make sure you discuss any questions you have with your health care provider. Document Revised: 06/28/2022 Document Reviewed: 06/28/2022 Elsevier Patient Education  2023 Elsevier Inc.  

## 2023-01-12 ENCOUNTER — Other Ambulatory Visit (HOSPITAL_COMMUNITY)
Admission: RE | Admit: 2023-01-12 | Discharge: 2023-01-12 | Disposition: A | Discharge status: Home / Self Care | Payer: Medicaid Other | Source: Ambulatory Visit | Attending: Licensed Practical Nurse | Admitting: Licensed Practical Nurse

## 2023-01-12 ENCOUNTER — Ambulatory Visit (INDEPENDENT_AMBULATORY_CARE_PROVIDER_SITE_OTHER): Payer: Medicaid Other | Admitting: Obstetrics and Gynecology

## 2023-01-12 ENCOUNTER — Other Ambulatory Visit: Payer: Medicaid Other

## 2023-01-12 ENCOUNTER — Encounter: Payer: Self-pay | Admitting: Obstetrics and Gynecology

## 2023-01-12 VITALS — BP 94/62 | HR 81 | Wt 176.2 lb

## 2023-01-12 DIAGNOSIS — N898 Other specified noninflammatory disorders of vagina: Secondary | ICD-10-CM | POA: Insufficient documentation

## 2023-01-12 DIAGNOSIS — Z23 Encounter for immunization: Secondary | ICD-10-CM

## 2023-01-12 DIAGNOSIS — Z3A30 30 weeks gestation of pregnancy: Secondary | ICD-10-CM | POA: Diagnosis present

## 2023-01-12 DIAGNOSIS — O0992 Supervision of high risk pregnancy, unspecified, second trimester: Secondary | ICD-10-CM | POA: Diagnosis present

## 2023-01-12 DIAGNOSIS — Z3483 Encounter for supervision of other normal pregnancy, third trimester: Secondary | ICD-10-CM

## 2023-01-12 DIAGNOSIS — Z3480 Encounter for supervision of other normal pregnancy, unspecified trimester: Secondary | ICD-10-CM

## 2023-01-12 LAB — POCT URINALYSIS DIPSTICK OB
Bilirubin, UA: NEGATIVE
Blood, UA: NEGATIVE
Glucose, UA: NEGATIVE
Ketones, UA: NEGATIVE
Nitrite, UA: NEGATIVE
Spec Grav, UA: 1.015 (ref 1.010–1.025)
Urobilinogen, UA: 0.2 E.U./dL
pH, UA: 6 (ref 5.0–8.0)

## 2023-01-12 NOTE — Progress Notes (Signed)
ROB: Patient is a 27 y.o. G3P2002 at [redacted]w[redacted]d who presents for routine OB care.  Pregnancy is complicated by history of asthma, anemia of pregnancy.  Patient continues to have complaints of vaginal odor.  Was recently tested for vaginitis at last visit but was negative. Desires retesting today.  Was seen in triage several weeks ago for abdominal pain.  Notes pain is better, but does express concerns about treatment provided at that visit, feels like the provider was insensitive and rude.  Does not desire to see this particular provider anymore. Advised that there were other providers in the office of whom she can be seen. Offered for patient to further discuss complaint with office manager if she desired.   For 28 week labs today.  Plans to  plans to breastfeed, desires undecided method for contraception. Declines circumcision for female infant.  For Tdap today, signed blood consent. RTC in 2 weeks.

## 2023-01-12 NOTE — Progress Notes (Signed)
ROB [redacted]w[redacted]d: She is doing well. She said she is having very bad vaginal odor. She was tested for yeast and BV on 12/08/2022 and the results were negative.  She reports good fetal movement. TDAP and BTC done today.

## 2023-01-13 LAB — 28 WEEK RH+PANEL
Basophils Absolute: 0 10*3/uL (ref 0.0–0.2)
Basos: 0 %
EOS (ABSOLUTE): 0.1 10*3/uL (ref 0.0–0.4)
Eos: 2 %
Gestational Diabetes Screen: 108 mg/dL (ref 70–139)
HIV Screen 4th Generation wRfx: NONREACTIVE
Hematocrit: 31.6 % — ABNORMAL LOW (ref 34.0–46.6)
Hemoglobin: 10.5 g/dL — ABNORMAL LOW (ref 11.1–15.9)
Immature Grans (Abs): 0 10*3/uL (ref 0.0–0.1)
Immature Granulocytes: 1 %
Lymphocytes Absolute: 1.4 10*3/uL (ref 0.7–3.1)
Lymphs: 23 %
MCH: 31.5 pg (ref 26.6–33.0)
MCHC: 33.2 g/dL (ref 31.5–35.7)
MCV: 95 fL (ref 79–97)
Monocytes Absolute: 0.4 10*3/uL (ref 0.1–0.9)
Monocytes: 6 %
Neutrophils Absolute: 4.1 10*3/uL (ref 1.4–7.0)
Neutrophils: 68 %
Platelets: 194 10*3/uL (ref 150–450)
RBC: 3.33 x10E6/uL — ABNORMAL LOW (ref 3.77–5.28)
RDW: 13.9 % (ref 11.7–15.4)
RPR Ser Ql: NONREACTIVE
WBC: 6.1 10*3/uL (ref 3.4–10.8)

## 2023-01-15 ENCOUNTER — Encounter: Payer: Self-pay | Admitting: Obstetrics and Gynecology

## 2023-01-16 ENCOUNTER — Other Ambulatory Visit: Payer: Self-pay | Admitting: Obstetrics and Gynecology

## 2023-01-16 DIAGNOSIS — B3731 Acute candidiasis of vulva and vagina: Secondary | ICD-10-CM

## 2023-01-16 LAB — CERVICOVAGINAL ANCILLARY ONLY
Bacterial Vaginitis (gardnerella): NEGATIVE
Candida Glabrata: NEGATIVE
Candida Vaginitis: POSITIVE — AB
Comment: NEGATIVE
Comment: NEGATIVE
Comment: NEGATIVE

## 2023-01-16 MED ORDER — TERCONAZOLE 0.4 % VA CREA: 1.0000 | TOPICAL_CREAM | Freq: Every day | VAGINAL | 0 refills | Status: AC

## 2023-01-17 ENCOUNTER — Encounter: Payer: Self-pay | Admitting: Obstetrics and Gynecology

## 2023-02-02 ENCOUNTER — Encounter: Payer: Self-pay | Admitting: Licensed Practical Nurse

## 2023-02-02 ENCOUNTER — Other Ambulatory Visit (HOSPITAL_COMMUNITY)
Admission: RE | Admit: 2023-02-02 | Discharge: 2023-02-02 | Disposition: A | Discharge status: Home / Self Care | Payer: Medicaid Other | Source: Ambulatory Visit | Attending: Licensed Practical Nurse | Admitting: Licensed Practical Nurse

## 2023-02-02 ENCOUNTER — Ambulatory Visit (INDEPENDENT_AMBULATORY_CARE_PROVIDER_SITE_OTHER): Payer: Medicaid Other | Admitting: Licensed Practical Nurse

## 2023-02-02 VITALS — BP 101/64 | HR 85 | Wt 181.3 lb

## 2023-02-02 DIAGNOSIS — Z3480 Encounter for supervision of other normal pregnancy, unspecified trimester: Secondary | ICD-10-CM

## 2023-02-02 DIAGNOSIS — N898 Other specified noninflammatory disorders of vagina: Secondary | ICD-10-CM

## 2023-02-02 DIAGNOSIS — Z3A33 33 weeks gestation of pregnancy: Secondary | ICD-10-CM

## 2023-02-02 DIAGNOSIS — Z3483 Encounter for supervision of other normal pregnancy, third trimester: Secondary | ICD-10-CM

## 2023-02-02 LAB — POCT URINALYSIS DIPSTICK
Bilirubin, UA: NEGATIVE
Blood, UA: NEGATIVE
Glucose, UA: NEGATIVE
Ketones, UA: NEGATIVE
Leukocytes, UA: NEGATIVE
Nitrite, UA: NEGATIVE
Protein, UA: NEGATIVE
Spec Grav, UA: 1.015 (ref 1.010–1.025)
Urobilinogen, UA: 0.2 E.U./dL
pH, UA: 7 (ref 5.0–8.0)

## 2023-02-02 NOTE — Progress Notes (Signed)
Routine Prenatal Care Visit  Subjective  Tammy Contreras is a 27 y.o. G3P2002 at [redacted]w[redacted]d being seen today for ongoing prenatal care.  She is currently monitored for the following issues for this low-risk pregnancy and has History of anemia; History of asthma; Supervision of other normal pregnancy, antepartum; Vitamin D deficiency; Anemia in pregnancy; Recurrent candidiasis of vagina; and Abdominal pain in pregnancy, antepartum on their problem list.  ----------------------------------------------------------------------------------- Patient reports  continues to have  a fish like vaginal odor and vaginal irritation and itching, has used Monistat and Terazol, will self swab -has itching on hr abd and breasts, feels it related to her stretching skin, declines cholestasis labs -notices swelling at the end of the work day, rec compression stockings -please with previous birth experiences, hopes for a quick birth -her husband will be with her in labor, her SIL or mother will watch her children while in labor, they have plenty of family for PP support.    Contractions: Irritability. Vag. Bleeding: None.  Movement: Present. Leaking Fluid denies.  ----------------------------------------------------------------------------------- The following portions of the patient's history were reviewed and updated as appropriate: allergies, current medications, past family history, past medical history, past social history, past surgical history and problem list. Problem list updated.  Objective  Blood pressure 101/64, pulse 85, weight 181 lb 4.8 oz (82.2 kg), last menstrual period 05/26/2022, currently breastfeeding. Pregravid weight 164 lb (74.4 kg) Total Weight Gain 17 lb 4.8 oz (7.847 kg) Urinalysis: Urine Protein    Urine Glucose    Fetal Status: Fetal Heart Rate (bpm): 150 Fundal Height: 34 cm Movement: Present     General:  Alert, oriented and cooperative. Patient is in no acute distress.  Skin: Skin  is warm and dry. No rash noted.   Cardiovascular: Normal heart rate noted  Respiratory: Normal respiratory effort, no problems with respiration noted  Abdomen: Soft, gravid, appropriate for gestational age. Pain/Pressure: Present     Pelvic:  Cervical exam deferred        Extremities: Normal range of motion.     Mental Status: Normal mood and affect. Normal behavior. Normal judgment and thought content.   Assessment   27 y.o. G3P2002 at [redacted]w[redacted]d by  03/20/2023, by Ultrasound presenting for routine prenatal visit  Plan   third Problems (from 07/31/22 to present)     Problem Noted Resolved   Vitamin D deficiency 08/15/2022 by Allie Bossier, MD No   Overview Signed 08/15/2022 11:44 AM by Allie Bossier, MD    Will need repeat level in about 6 months      Anemia in pregnancy 08/15/2022 by Allie Bossier, MD No        Preterm labor symptoms and general obstetric precautions including but not limited to vaginal bleeding, contractions, leaking of fluid and fetal movement were reviewed in detail with the patient. Please refer to After Visit Summary for other counseling recommendations.   Return in about 2 weeks (around 02/16/2023) for ROB.  Aptima swab sent Carie Caddy, CNM   [facility]  02/02/23  10:55 AM

## 2023-02-05 ENCOUNTER — Observation Stay
Admission: EM | Admit: 2023-02-05 | Discharge: 2023-02-05 | Disposition: A | Discharge status: Home / Self Care | Payer: Medicaid Other | Attending: Obstetrics and Gynecology | Admitting: Obstetrics and Gynecology

## 2023-02-05 ENCOUNTER — Encounter: Payer: Self-pay | Admitting: Obstetrics and Gynecology

## 2023-02-05 ENCOUNTER — Telehealth: Payer: Self-pay

## 2023-02-05 DIAGNOSIS — O26893 Other specified pregnancy related conditions, third trimester: Principal | ICD-10-CM | POA: Insufficient documentation

## 2023-02-05 DIAGNOSIS — Z3A33 33 weeks gestation of pregnancy: Secondary | ICD-10-CM

## 2023-02-05 DIAGNOSIS — O99613 Diseases of the digestive system complicating pregnancy, third trimester: Secondary | ICD-10-CM | POA: Diagnosis not present

## 2023-02-05 DIAGNOSIS — E559 Vitamin D deficiency, unspecified: Secondary | ICD-10-CM

## 2023-02-05 DIAGNOSIS — K529 Noninfective gastroenteritis and colitis, unspecified: Secondary | ICD-10-CM | POA: Diagnosis not present

## 2023-02-05 LAB — URINALYSIS, COMPLETE (UACMP) WITH MICROSCOPIC
Bilirubin Urine: NEGATIVE
Glucose, UA: NEGATIVE mg/dL
Hgb urine dipstick: NEGATIVE
Ketones, ur: 20 mg/dL — AB
Nitrite: NEGATIVE
Protein, ur: 100 mg/dL — AB
Specific Gravity, Urine: 1.021 (ref 1.005–1.030)
pH: 6 (ref 5.0–8.0)

## 2023-02-05 LAB — CERVICOVAGINAL ANCILLARY ONLY
Bacterial Vaginitis (gardnerella): NEGATIVE
Candida Glabrata: NEGATIVE
Candida Vaginitis: NEGATIVE
Chlamydia: NEGATIVE
Comment: NEGATIVE
Comment: NEGATIVE
Comment: NEGATIVE
Comment: NEGATIVE
Comment: NEGATIVE
Comment: NORMAL
Neisseria Gonorrhea: NEGATIVE
Trichomonas: NEGATIVE

## 2023-02-05 LAB — COMPREHENSIVE METABOLIC PANEL
ALT: 21 U/L (ref 0–44)
AST: 23 U/L (ref 15–41)
Albumin: 2.9 g/dL — ABNORMAL LOW (ref 3.5–5.0)
Alkaline Phosphatase: 121 U/L (ref 38–126)
Anion gap: 8 (ref 5–15)
BUN: 7 mg/dL (ref 6–20)
CO2: 19 mmol/L — ABNORMAL LOW (ref 22–32)
Calcium: 8.3 mg/dL — ABNORMAL LOW (ref 8.9–10.3)
Chloride: 107 mmol/L (ref 98–111)
Creatinine, Ser: 0.41 mg/dL — ABNORMAL LOW (ref 0.44–1.00)
GFR, Estimated: >60 mL/min (ref >60)
Glucose, Bld: 83 mg/dL (ref 70–99)
Potassium: 3.8 mmol/L (ref 3.5–5.1)
Sodium: 134 mmol/L — ABNORMAL LOW (ref 135–145)
Total Bilirubin: 1.1 mg/dL (ref 0.3–1.2)
Total Protein: 6.2 g/dL — ABNORMAL LOW (ref 6.5–8.1)

## 2023-02-05 LAB — CBC
HCT: 30.2 % — ABNORMAL LOW (ref 36.0–46.0)
Hemoglobin: 10.1 g/dL — ABNORMAL LOW (ref 12.0–15.0)
MCH: 32.5 pg (ref 26.0–34.0)
MCHC: 33.4 g/dL (ref 30.0–36.0)
MCV: 97.1 fL (ref 80.0–100.0)
Platelets: 153 10*3/uL (ref 150–400)
RBC: 3.11 MIL/uL — ABNORMAL LOW (ref 3.87–5.11)
RDW: 15.3 % (ref 11.5–15.5)
WBC: 4.9 10*3/uL (ref 4.0–10.5)
nRBC: 0 % (ref 0.0–0.2)

## 2023-02-05 MED ORDER — LACTATED RINGERS IV SOLN: INTRAVENOUS | Status: DC

## 2023-02-05 MED ORDER — LACTATED RINGERS IV BOLUS
1000.0000 mL | Freq: Once | INTRAVENOUS | Status: AC
  Administered 2023-02-05: 1000 mL via INTRAVENOUS

## 2023-02-05 MED ORDER — ACETAMINOPHEN 500 MG PO TABS
1000.0000 mg | ORAL_TABLET | Freq: Four times a day (QID) | ORAL | Status: DC | PRN
  Administered 2023-02-05: 1000 mg via ORAL
  Filled 2023-02-05: qty 2

## 2023-02-05 MED ORDER — ONDANSETRON HCL 4 MG/2ML IJ SOLN
4.0000 mg | Freq: Four times a day (QID) | INTRAMUSCULAR | Status: DC | PRN
  Administered 2023-02-05: 4 mg via INTRAVENOUS
  Filled 2023-02-05: qty 2

## 2023-02-05 MED ORDER — LOPERAMIDE HCL 2 MG PO CAPS
4.0000 mg | ORAL_CAPSULE | Freq: Four times a day (QID) | ORAL | Status: DC | PRN
  Administered 2023-02-05: 4 mg via ORAL
  Filled 2023-02-05 (×2): qty 2

## 2023-02-05 MED ORDER — ONDANSETRON 4 MG PO TBDP: 4.0000 mg | ORAL_TABLET | Freq: Four times a day (QID) | ORAL | 0 refills | Status: DC | PRN

## 2023-02-05 NOTE — Telephone Encounter (Signed)
Patient contacted office with concerns of watery diarrhea for the past 48hrs. Patient states that she has had >5 bowel movements a day and reports that she is unable to hold any fluids. Patient reports symptoms of fatigue, weakness, and vomiting she states that she has tried over the counter medication but it has not improved. Advised patient given symptoms she is experiencing is signs of possible dehydration and or viral symptoms. Patient advised to go to ER for evaluation and treatment for symptoms since there are uncontrolled on otc medication. KW

## 2023-02-05 NOTE — Discharge Summary (Signed)
Physician Final Progress Note  Patient ID: Tammy Contreras MRN: [id number] DOB/AGE: 1996-06-06 27 y.o.  Admit date: 02/05/2023 Admitting provider: Tresea Mall, CNM Discharge date: 02/05/2023   Admission Diagnoses:  1) intrauterine pregnancy at [redacted]w[redacted]d  2) diarrhea, nausea, headache  Discharge Diagnoses:  Principal Problem:   Labor and delivery, indication for care Active Problems:   Acute gastroenteritis   [redacted] weeks gestation of pregnancy    History of Present Illness: The patient is a 27 y.o. female G3P2002 at [redacted]w[redacted]d who presents for GI symptoms that started yesterday. She reports a fever this morning as well as body aches and headache. She has not been able to eat or drink much due to nausea. She reports good fetal movement. She denies vaginal bleeding or leaking of fluid. She does have occasional contractions. She was admitted for observation. IV started and labs collected. Reactive NST. No contractions. Ketones present in UA. She does feel better after having IV fluids and she was able to keep ginger ale and crackers down. She is discharged to home with instructions and precautions. Rx zofran.   Past Medical History:  Diagnosis Date   Anemia     Past Surgical History:  Procedure Laterality Date   WISDOM TOOTH EXTRACTION      No current facility-administered medications on file prior to encounter.   Current Outpatient Medications on File Prior to Encounter  Medication Sig Dispense Refill   acetaminophen (TYLENOL) 500 MG tablet Take 500 mg by mouth every 6 (six) hours as needed for moderate pain.     loperamide (IMODIUM A-D) 2 MG tablet Take 900 mg by mouth 4 (four) times daily as needed for diarrhea or loose stools.     Prenatal Vit-Fe Fumarate-FA (MULTIVITAMIN-PRENATAL) 27-0.8 MG TABS tablet Take 2 tablets by mouth daily at 12 noon.      No Known Allergies  Social History   Socioeconomic History   Marital status: Married    Spouse name: Leretha Pol   Number of  children: 2   Years of education: 14   Highest education level: Not on file  Occupational History   Occupation: Sales executive  Tobacco Use   Smoking status: Never   Smokeless tobacco: Never  Vaping Use   Vaping Use: Never used  Substance and Sexual Activity   Alcohol use: Not Currently   Drug use: Never   Sexual activity: Yes    Partners: Male    Birth control/protection: None  Other Topics Concern   Not on file  Social History Narrative   ** Merged History Encounter **       Social Determinants of Health   Financial Resource Strain: Low Risk  (07/31/2022)   Overall Financial Resource Strain (CARDIA)    Difficulty of Paying Living Expenses: Not very hard  Food Insecurity: No Food Insecurity (07/31/2022)   Hunger Vital Sign    Worried About Running Out of Food in the Last Year: Never true    Ran Out of Food in the Last Year: Never true  Transportation Needs: No Transportation Needs (07/31/2022)   PRAPARE - Administrator, Civil Service (Medical): No    Lack of Transportation (Non-Medical): No  Physical Activity: Inactive (07/31/2022)   Exercise Vital Sign    Days of Exercise per Week: 0 days    Minutes of Exercise per Session: 0 min  Stress: No Stress Concern Present (07/31/2022)   [employer] of Occupational Health - Occupational Stress Questionnaire    Feeling of Stress :  Not at all  Social Connections: Moderately Integrated (07/31/2022)   Social Connection and Isolation Panel [NHANES]    Frequency of Communication with Friends and Family: More than three times a week    Frequency of Social Gatherings with Friends and Family: Twice a week    Attends Religious Services: More than 4 times per year    Active Member of [employer] or Organizations: No    Attends Banker Meetings: Never    Marital Status: Married  Catering manager Violence: Not At Risk (07/31/2022)   Humiliation, Afraid, Rape, and Kick questionnaire    Fear of Current or  Ex-Partner: No    Emotionally Abused: No    Physically Abused: No    Sexually Abused: No    Family History  Problem Relation Age of Onset   Hypertension Mother        borderline   Diabetes Mother        Borderline   Hyperlipidemia Mother        borderline   Healthy Father    Healthy Sister    Asthma Brother    Diabetes Maternal Grandfather    Hypertension Maternal Grandfather    Healthy Paternal Grandmother    Hyperlipidemia Paternal Grandfather    Diabetes Paternal Grandfather    Hypertension Paternal Grandfather    Cataracts Paternal Grandfather    Breast cancer Neg Hx    Ovarian cancer Neg Hx    Colon cancer Neg Hx      Review of Systems  Constitutional:  Positive for fever. Negative for chills.  HENT:  Negative for congestion, ear discharge, ear pain, hearing loss, sinus pain and sore throat.   Eyes:  Negative for blurred vision and double vision.  Respiratory:  Negative for cough, shortness of breath and wheezing.   Cardiovascular:  Negative for chest pain, palpitations and leg swelling.  Gastrointestinal:  Positive for diarrhea and nausea. Negative for abdominal pain, blood in stool, constipation, heartburn, melena and vomiting.  Genitourinary:  Negative for dysuria, flank pain, frequency, hematuria and urgency.  Musculoskeletal:  Positive for myalgias. Negative for back pain and joint pain.  Skin:  Negative for itching and rash.  Neurological:  Positive for headaches. Negative for dizziness, tingling, tremors, sensory change, speech change, focal weakness, seizures, loss of consciousness and weakness.  Endo/Heme/Allergies:  Negative for environmental allergies. Does not bruise/bleed easily.  Psychiatric/Behavioral:  Negative for depression, hallucinations, memory loss, substance abuse and suicidal ideas. The patient is not nervous/anxious and does not have insomnia.      Physical Exam: BP 120/73   Pulse (!) 101   Temp 99.8 F (37.7 C) (Oral)   Resp 18   LMP  05/26/2022 (Exact Date)   Constitutional: Well nourished, well developed female in no acute distress.  HEENT: normal Skin: Warm and dry.  Cardiovascular: Regular rate and rhythm.   Extremity:  no edema   Respiratory: Clear to auscultation bilateral. Normal respiratory effort Abdomen: FHT present Psych: Alert and Oriented x3. No memory deficits. Normal mood and affect.   Toco: rare contraction Fetal well being: 150 bpm, moderate variability, +accelerations, -decelerations  Consults: None  Significant Findings/ Diagnostic Studies: labs:   Latest Reference Range & Units 02/05/23 17:09 02/05/23 17:14  COMPREHENSIVE METABOLIC PANEL   Rpt !  Sodium 135 - 145 mmol/L  134 (L)  Potassium 3.5 - 5.1 mmol/L  3.8  Chloride 98 - 111 mmol/L  107  CO2 22 - 32 mmol/L  19 (L)  Glucose 70 -  99 mg/dL  83  BUN 6 - 20 mg/dL  7  Creatinine 4.09 - 8.11 mg/dL  9.14 (L)  Calcium 8.9 - 10.3 mg/dL  8.3 (L)  Anion gap 5 - 15   8  Alkaline Phosphatase 38 - 126 U/L  121  Albumin 3.5 - 5.0 g/dL  2.9 (L)  AST 15 - 41 U/L  23  ALT 0 - 44 U/L  21  Total Protein 6.5 - 8.1 g/dL  6.2 (L)  Total Bilirubin 0.3 - 1.2 mg/dL  1.1  GFR, Estimated >78 mL/min  >60  WBC 4.0 - 10.5 K/uL  4.9  RBC 3.87 - 5.11 MIL/uL  3.11 (L)  Hemoglobin 12.0 - 15.0 g/dL  29.5 (L)  HCT 62.1 - 30.8 %  30.2 (L)  MCV 80.0 - 100.0 fL  97.1  MCH 26.0 - 34.0 pg  32.5  MCHC 30.0 - 36.0 g/dL  65.7  RDW 84.6 - 96.2 %  15.3  Platelets 150 - 400 K/uL  153  nRBC 0.0 - 0.2 %  0.0  Appearance CLEAR  CLOUDY !   Bilirubin Urine NEGATIVE  NEGATIVE   Color, Urine YELLOW  YELLOW !   Glucose, UA NEGATIVE mg/dL NEGATIVE   Hgb urine dipstick NEGATIVE  NEGATIVE   Ketones, ur NEGATIVE mg/dL 20 !   Leukocytes,Ua NEGATIVE  SMALL !   Nitrite NEGATIVE  NEGATIVE   pH 5.0 - 8.0  6.0   Protein NEGATIVE mg/dL 952 !   Specific Gravity, Urine 1.005 - 1.030  1.021   Bacteria, UA NONE SEEN  MANY !   Mucus  PRESENT   RBC / HPF 0 - 5 RBC/hpf 0-5   Squamous  Epithelial / HPF 0 - 5 /HPF 11-20   WBC, UA 0 - 5 WBC/hpf 0-5   !: Data is abnormal (L): Data is abnormally low Rpt: View report in Results Review for more information  Procedures: NST  Hospital Course: The patient was admitted to Labor and Delivery Triage for observation.   Discharge Condition: good  Disposition: Discharge disposition: 01-Home or Self Care  Diet: Advance diet as directed, stay hydrated  Discharge Activity: Activity as tolerated  Discharge Instructions     Discharge activity:  No Restrictions   Complete by: As directed    Discharge diet:  No restrictions   Complete by: As directed    Stay hydrated      Allergies as of 02/05/2023   No Known Allergies      Medication List  May take OTC Imodium as needed for 2-3 days  TAKE these medications    acetaminophen 500 MG tablet Commonly known as: TYLENOL Take 500 mg by mouth every 6 (six) hours as needed for moderate pain/fever.   multivitamin-prenatal 27-0.8 MG Tabs tablet Take 2 tablets by mouth daily at 12 noon.   ondansetron 4 MG disintegrating tablet Commonly known as: ZOFRAN-ODT Take 1 tablet (4 mg total) by mouth every 6 (six) hours as needed for nausea.        Follow-up Information     [facility]. Go to.   Why: scheduled appointment Contact information: [address] [place] [address] [phone]                Total time spent taking care of this patient: 22 minutes  Signed: Tresea Mall, CNM  02/05/2023, 8:25 PM

## 2023-02-05 NOTE — OB Triage Note (Signed)
Patient to obs 2 with complaints of diarrhea since yesterday. Unable to keep fluids down since last night due to nausea, but no vomiting.  She has not had solid food since yesterday.  She reports that she has body aches, headache since last night.   She took 500mg  tylenol last night and this morning without relief. She reports baby is moving well. No bleeding or LOF.  She does feel an occasional contraction.  She has been around a niece who has had the same symptoms last week,.

## 2023-02-16 ENCOUNTER — Encounter: Payer: Self-pay | Admitting: Licensed Practical Nurse

## 2023-02-16 ENCOUNTER — Ambulatory Visit (INDEPENDENT_AMBULATORY_CARE_PROVIDER_SITE_OTHER): Payer: Medicaid Other | Admitting: Licensed Practical Nurse

## 2023-02-16 VITALS — BP 108/70 | HR 60 | Wt 184.8 lb

## 2023-02-16 DIAGNOSIS — D649 Anemia, unspecified: Secondary | ICD-10-CM

## 2023-02-16 DIAGNOSIS — L299 Pruritus, unspecified: Secondary | ICD-10-CM

## 2023-02-16 DIAGNOSIS — O322XX0 Maternal care for transverse and oblique lie, not applicable or unspecified: Secondary | ICD-10-CM

## 2023-02-16 DIAGNOSIS — O99283 Endocrine, nutritional and metabolic diseases complicating pregnancy, third trimester: Secondary | ICD-10-CM

## 2023-02-16 DIAGNOSIS — Z3A35 35 weeks gestation of pregnancy: Secondary | ICD-10-CM

## 2023-02-16 DIAGNOSIS — O99013 Anemia complicating pregnancy, third trimester: Secondary | ICD-10-CM

## 2023-02-16 DIAGNOSIS — E559 Vitamin D deficiency, unspecified: Secondary | ICD-10-CM

## 2023-02-16 DIAGNOSIS — Z3480 Encounter for supervision of other normal pregnancy, unspecified trimester: Secondary | ICD-10-CM

## 2023-02-16 LAB — POCT URINALYSIS DIPSTICK
Bilirubin, UA: NEGATIVE
Blood, UA: NEGATIVE
Glucose, UA: NEGATIVE
Ketones, UA: NEGATIVE
Leukocytes, UA: NEGATIVE
Nitrite, UA: NEGATIVE
Protein, UA: POSITIVE — AB
Spec Grav, UA: 1.02 (ref 1.010–1.025)
Urobilinogen, UA: 0.2 E.U./dL
pH, UA: 7.5 (ref 5.0–8.0)

## 2023-02-18 NOTE — Progress Notes (Signed)
Routine Prenatal Care Visit  Subjective  Tammy Contreras is a 27 y.o. G3P2002 at [redacted]w[redacted]d being seen today for ongoing prenatal care.  She is currently monitored for the following issues for this low-risk pregnancy and has History of anemia; History of asthma; Supervision of other normal pregnancy, antepartum; Vitamin D deficiency; Anemia in pregnancy; Recurrent candidiasis of vagina; Abdominal pain in pregnancy, antepartum; Labor and delivery, indication for care; Acute gastroenteritis; and [redacted] weeks gestation of pregnancy on their problem list.  ----------------------------------------------------------------------------------- Patient reports backache-needs to stand up after sitting for 30 mins, gets relief with position change  Not sleeping well. The tops of her feet have been itchy, the itchiness occurs mostly after removing her socks at the end of the day.  will do cholestasis labs. -with pt reclined back rather prominent fetal part  visible through top of abdomen, Dr Valentino Saxon in to help assess fetal lie, fetus oblige with head towards mother's right hip. Spinning babies H.O given, may need version if still oblique.   Contractions: Irritability. Vag. Bleeding: None.  Movement: Present. Leaking Fluid denies.  ----------------------------------------------------------------------------------- The following portions of the patient's history were reviewed and updated as appropriate: allergies, current medications, past family history, past medical history, past social history, past surgical history and problem list. Problem list updated.  Objective  Blood pressure 108/70, pulse 60, weight 184 lb 12.8 oz (83.8 kg), last menstrual period 05/26/2022, currently breastfeeding. Pregravid weight 164 lb (74.4 kg) Total Weight Gain 20 lb 12.8 oz (9.435 kg) Urinalysis: Urine Protein    Urine Glucose    Fetal Status: Fetal Heart Rate (bpm): 133 Fundal Height: 36 cm Movement: Present     General:  Alert,  oriented and cooperative. Patient is in no acute distress.  Skin: Skin is warm and dry. No rash noted.   Cardiovascular: Normal heart rate noted  Respiratory: Normal respiratory effort, no problems with respiration noted  Abdomen: Soft, gravid, appropriate for gestational age. Pain/Pressure: Present     Pelvic:  Cervical exam deferred        Extremities: Normal range of motion.     Mental Status: Normal mood and affect. Normal behavior. Normal judgment and thought content.   Assessment   27 y.o. [id number] at [redacted]w[redacted]d by  03/20/2023, by Ultrasound presenting for routine prenatal visit  Plan   third Problems (from 07/31/22 to present)     Problem Noted Resolved   Vitamin D deficiency 08/15/2022 by Allie Bossier, MD No   Overview Signed 08/15/2022 11:44 AM by Allie Bossier, MD    Will need repeat level in about 6 months      Anemia in pregnancy 08/15/2022 by Allie Bossier, MD No        Preterm labor symptoms and general obstetric precautions including but not limited to vaginal bleeding, contractions, leaking of fluid and fetal movement were reviewed in detail with the patient. Please refer to After Visit Summary for other counseling recommendations.   Return in about 1 week (around 02/23/2023) for ROB. Cholestasis labs collected   36 wk labs at next visit   Jannifer Hick   [facility]  02/18/23  4:25 PM

## 2023-02-19 LAB — COMPREHENSIVE METABOLIC PANEL
ALT: 17 IU/L (ref 0–32)
AST: 15 IU/L (ref 0–40)
Albumin/Globulin Ratio: 1.3 (ref 1.2–2.2)
Albumin: 3.4 g/dL — ABNORMAL LOW (ref 4.0–5.0)
Alkaline Phosphatase: 157 IU/L — ABNORMAL HIGH (ref 44–121)
BUN/Creatinine Ratio: 15 (ref 9–23)
BUN: 8 mg/dL (ref 6–20)
Bilirubin Total: 0.3 mg/dL (ref 0.0–1.2)
CO2: 22 mmol/L (ref 20–29)
Calcium: 8.7 mg/dL (ref 8.7–10.2)
Chloride: 106 mmol/L (ref 96–106)
Creatinine, Ser: 0.53 mg/dL — ABNORMAL LOW (ref 0.57–1.00)
Globulin, Total: 2.7 g/dL (ref 1.5–4.5)
Glucose: 68 mg/dL — ABNORMAL LOW (ref 70–99)
Potassium: 4.5 mmol/L (ref 3.5–5.2)
Sodium: 139 mmol/L (ref 134–144)
Total Protein: 6.1 g/dL (ref 6.0–8.5)
eGFR: 130 mL/min/{1.73_m2} (ref >59)

## 2023-02-19 LAB — BILE ACIDS, TOTAL: Bile Acids Total: 15.1 umol/L — ABNORMAL HIGH (ref 0.0–10.0)

## 2023-02-19 LAB — URINE CULTURE: Organism ID, Bacteria: NO GROWTH

## 2023-02-20 ENCOUNTER — Telehealth: Payer: Self-pay | Admitting: Obstetrics

## 2023-02-20 NOTE — Telephone Encounter (Signed)
I contacted the patient via phone. I left voicemail for the patient to call back to be schedule of this week Routine follow up. Cancellation today with Erskine Squibb at 10:55 am and opening tomorrow 5/22 with Dr. Logan Bores at 11:15.

## 2023-02-20 NOTE — Telephone Encounter (Signed)
Patient called back and was unable to accept either appointment and stated that she would just keep the appointment on 03/02/2023

## 2023-02-22 ENCOUNTER — Encounter: Payer: Self-pay | Admitting: Licensed Practical Nurse

## 2023-02-22 ENCOUNTER — Other Ambulatory Visit: Payer: Self-pay | Admitting: Licensed Practical Nurse

## 2023-02-22 DIAGNOSIS — O26643 Intrahepatic cholestasis of pregnancy, third trimester: Secondary | ICD-10-CM

## 2023-02-22 MED ORDER — URSODIOL 500 MG PO TABS
500.0000 mg | ORAL_TABLET | Freq: Two times a day (BID) | ORAL | 3 refills | Status: DC
Start: 2023-02-22 — End: 2023-05-14

## 2023-02-22 NOTE — Progress Notes (Signed)
Pt reported itching on 5/17, Bile acids 15.1, reviewed results with Dr Valentino Saxon.  Will start Ursodiol, weekly NST and IOL at 38 wks. TC to [facility], reviewed results and diagnosis for ICP. Reviewed recommendations to start Ursodiol, weekly NST and IOL at 38 weeks. Pt would like to talk with her husband about induction, pt seems apprehensive about induction. Pt penciled in for IOL on June 4 at 0800. Should have NST tomorrow, Cyrstal Stanley and frond desk messaged to scheduled.  Next ROB on 5/31, will need NST then as well.  Ursodiol sent to pharmacy on file.  Carie Caddy, CNM   [facility]  02/22/23  6:48 PM

## 2023-02-23 ENCOUNTER — Ambulatory Visit (INDEPENDENT_AMBULATORY_CARE_PROVIDER_SITE_OTHER): Payer: Medicaid Other

## 2023-02-23 ENCOUNTER — Telehealth: Payer: Self-pay

## 2023-02-23 ENCOUNTER — Other Ambulatory Visit (HOSPITAL_COMMUNITY)
Admission: RE | Admit: 2023-02-23 | Discharge: 2023-02-23 | Disposition: A | Discharge status: Home / Self Care | Payer: Medicaid Other | Source: Ambulatory Visit | Attending: Certified Nurse Midwife | Admitting: Certified Nurse Midwife

## 2023-02-23 VITALS — BP 111/72 | HR 82 | Ht 63.0 in | Wt 183.9 lb

## 2023-02-23 DIAGNOSIS — Z113 Encounter for screening for infections with a predominantly sexual mode of transmission: Secondary | ICD-10-CM | POA: Insufficient documentation

## 2023-02-23 DIAGNOSIS — Z3A36 36 weeks gestation of pregnancy: Secondary | ICD-10-CM | POA: Insufficient documentation

## 2023-02-23 DIAGNOSIS — O26643 Intrahepatic cholestasis of pregnancy, third trimester: Secondary | ICD-10-CM | POA: Diagnosis not present

## 2023-02-23 DIAGNOSIS — Z3685 Encounter for antenatal screening for Streptococcus B: Secondary | ICD-10-CM

## 2023-02-23 NOTE — Telephone Encounter (Signed)
Tammy Contreras was seen in office today, at check out she told April she saw Isabelle Course and Isabelle Course gave her two induction dates and she wants Wednesday 29th.

## 2023-02-23 NOTE — Progress Notes (Signed)
    NURSE VISIT NOTE  Subjective:    Patient ID: Tammy Contreras, female    DOB: 01/25/1996, 27 y.o.   MRN: [id number]  HPI  Patient is a 27 y.o. G36P2002 female who presents for fetal monitoring per order from Carie Caddy, [place]. Patient did not have routine visit with provider this week.  Objective:    BP 111/72   Pulse 82   Ht 5\' 3"  (1.6 m)   Wt 183 lb 14.4 oz (83.4 kg)   LMP 05/26/2022 (Exact Date)   BMI 32.58 kg/m  Estimated Date of Delivery: 03/20/23  [redacted]w[redacted]d  Fetus A Non-Stress Test Interpretation for 02/23/23  Indication:  Cholestasis  Fetal Heart Rate A Mode: External Baseline Rate (A): 130 bpm Variability: Moderate Accelerations: 15 x 15 Decelerations: None Scalp Stimulation: Positive  Uterine Activity Mode: Toco Contraction Frequency (min): none  Interpretation (Fetal Testing) Nonstress Test Interpretation: Reactive Overall Impression: Reassuring for gestational age   Assessment:   1. Cholestasis during pregnancy in third trimester   2. [redacted] weeks gestation of pregnancy   3. Screening examination for STI   4. Screening, antenatal, for Streptococcus B      Plan:   Results reviewed by Hartley Barefoot, CNM and discussed with patient.  Patient self-collected 27 week swabs for GBS/GC/CT testing.     Rocco Serene, LPN

## 2023-02-23 NOTE — Telephone Encounter (Signed)
Schedule for 02/28/23 at [facility]

## 2023-02-25 LAB — STREP GP B NAA: Strep Gp B NAA: NEGATIVE

## 2023-02-27 ENCOUNTER — Telehealth: Payer: Self-pay

## 2023-02-27 ENCOUNTER — Encounter: Payer: Self-pay | Admitting: Certified Nurse Midwife

## 2023-02-27 LAB — CERVICOVAGINAL ANCILLARY ONLY
Chlamydia: NEGATIVE
Comment: NEGATIVE
Comment: NORMAL
Neisseria Gonorrhea: NEGATIVE

## 2023-02-27 NOTE — Telephone Encounter (Signed)
Pt calling; nurse at hosp called and said her IOL was tonight; pt was told it was next week; did it get moved with her being notified?  [phone]  Adv pt I called L&D and was told she was going to get a phone call shortly from [facility].  Pt states "so you don't know...."  adv that was all they told me.  I apologized. Pt said "okay".

## 2023-03-02 ENCOUNTER — Observation Stay
Admission: EM | Admit: 2023-03-02 | Discharge: 2023-03-02 | Disposition: A | Discharge status: Home / Self Care | Payer: Medicaid Other

## 2023-03-02 ENCOUNTER — Encounter: Payer: Self-pay | Admitting: Obstetrics

## 2023-03-02 ENCOUNTER — Ambulatory Visit (INDEPENDENT_AMBULATORY_CARE_PROVIDER_SITE_OTHER): Payer: Medicaid Other | Admitting: Certified Nurse Midwife

## 2023-03-02 ENCOUNTER — Ambulatory Visit (INDEPENDENT_AMBULATORY_CARE_PROVIDER_SITE_OTHER): Payer: Medicaid Other

## 2023-03-02 ENCOUNTER — Other Ambulatory Visit: Payer: Self-pay

## 2023-03-02 VITALS — BP 120/80 | HR 67 | Wt 192.0 lb

## 2023-03-02 DIAGNOSIS — Z3A37 37 weeks gestation of pregnancy: Secondary | ICD-10-CM | POA: Diagnosis not present

## 2023-03-02 DIAGNOSIS — O368330 Maternal care for abnormalities of the fetal heart rate or rhythm, third trimester, not applicable or unspecified: Secondary | ICD-10-CM | POA: Insufficient documentation

## 2023-03-02 DIAGNOSIS — Z3480 Encounter for supervision of other normal pregnancy, unspecified trimester: Secondary | ICD-10-CM

## 2023-03-02 DIAGNOSIS — O4292 Full-term premature rupture of membranes, unspecified as to length of time between rupture and onset of labor: Principal | ICD-10-CM | POA: Insufficient documentation

## 2023-03-02 DIAGNOSIS — E559 Vitamin D deficiency, unspecified: Secondary | ICD-10-CM

## 2023-03-02 DIAGNOSIS — O26643 Intrahepatic cholestasis of pregnancy, third trimester: Secondary | ICD-10-CM

## 2023-03-02 DIAGNOSIS — O99012 Anemia complicating pregnancy, second trimester: Secondary | ICD-10-CM

## 2023-03-02 LAB — WET PREP, GENITAL
Clue Cells Wet Prep HPF POC: NONE SEEN
Sperm: NONE SEEN
Trich, Wet Prep: NONE SEEN
WBC, Wet Prep HPF POC: <10 (ref <10)
Yeast Wet Prep HPF POC: NONE SEEN

## 2023-03-02 LAB — ROM PLUS (ARMC ONLY): Rom Plus: NEGATIVE

## 2023-03-02 MED ORDER — ONDANSETRON HCL 4 MG/2ML IJ SOLN: 4.0000 mg | Freq: Four times a day (QID) | INTRAMUSCULAR | Status: DC | PRN

## 2023-03-02 NOTE — OB Triage Note (Signed)
Discharge instructions, labor precautions, and follow-up care reviewed with patient and significant other. All questions answered. Patient verbalized understanding. Discharged ambulatory off unit.  

## 2023-03-02 NOTE — OB Triage Note (Signed)
LABOR & DELIVERY OB TRIAGE NOTE  SUBJECTIVE  HPI Tammy Contreras is a 27 y.o. G3P2002 at [redacted]w[redacted]d who presents to Labor & Delivery for r/o rupture of membranes.  This morning while in the shower, she noted some fluid leaking and she was unsure if it was urine or vaginal discharge. She wore a pad throughout the day with some dampness noted but not completely soaked. She was seen in clinic today by Hartley Barefoot, CNM, and evaluated for possible ROM. On that exam, she was found to be nitrazine negative but positive pooling. She was sent to L&D triage for further work up with a ROM+ test.   She reports occasional tightening of the belly that is not painful. Her pregnancy is also complicated by cholestasis and transverse fetal lie.   OB History     Gravida  3   Para  2   Term  2   Preterm  0   AB  0   Living  2      SAB  0   IAB  0   Ectopic  0   Multiple      Live Births  2           Scheduled Meds: none Continuous Infusions: none PRN Meds:.ondansetron  OBJECTIVE  BP (!) 140/88 (BP Location: Right Arm)   Pulse 67   Temp 98.1 F (36.7 C) (Oral)   Resp 16   Ht 5\' 3"  (1.6 m)   Wt 87.1 kg   LMP 05/26/2022 (Exact Date)   BMI 34.01 kg/m   General: A&Ox3 Lungs: Normal work of breathing Abdomen: Soft, gravid, non-tender Cervical exam:   deferred  NST I reviewed the NST and it was reactive.  Baseline: 135 bpm Variability: moderate Accelerations: present Decelerations:none Toco: uterine irritability Category I  ASSESSMENT Impression  1) Pregnancy at G3P2002, [redacted]w[redacted]d, Estimated Date of Delivery: 03/20/23 2) Reassuring maternal/fetal status 3) Negative ROM+, Negative Wet prep 2) Reviewed delivery planning. She is currently scheduled for an ECV and IOL on 03/06/23 but after discussion, she no longer desires to attempt ECV as she had a previous negative experience. Her desire is a planned CS unless the baby is found to be cephalic at the time she comes  in for her procedure.   PLAN 1) Stable for discharge. Reviewed labor precautions. 2) Will contact MD to determine timing for planned CS and follow up with patient regarding scheduling.   Autumn Messing, CNM 03/02/23  6:38 PM

## 2023-03-02 NOTE — Progress Notes (Unsigned)
    NURSE VISIT NOTE  Subjective:    Patient ID: Tammy Contreras, female    DOB: Apr 22, 1996, 27 y.o.   MRN: [id number]  HPI  Patient is a 27 y.o. G14P2002 female who presents for fetal monitoring per order from Carie Caddy, [place].   Objective:    LMP 05/26/2022 (Exact Date)  Estimated Date of Delivery: 03/20/23  [redacted]w[redacted]d  Fetus A Non-Stress Test Interpretation for 03/02/23  Indication: Cholestasis   Fetal Heart Rate A Mode: External Baseline Rate (A): 135 bpm Variability: Moderate Accelerations: 15 x 15 Decelerations: None Multiple birth?: No         Assessment:   1. Cholestasis during pregnancy in third trimester   2. [redacted] weeks gestation of pregnancy   3. Supervision of other normal pregnancy, antepartum      Plan:   Results reviewed and discussed with patient by  Hartley Barefoot, CNM.     Santiago Bumpers, CMA [facility]

## 2023-03-02 NOTE — Progress Notes (Signed)
   PRENATAL VISIT NOTE  Subjective:  Tammy Contreras is a 27 y.o. G3P2002 at [redacted]w[redacted]d being seen today for ongoing prenatal care.  She is currently monitored for the following issues for this high-risk pregnancy and has History of anemia; History of asthma; Supervision of other normal pregnancy, antepartum; Vitamin D deficiency; Anemia in pregnancy; Recurrent candidiasis of vagina; Abdominal pain in pregnancy, antepartum; Labor and delivery, indication for care; Acute gastroenteritis; [redacted] weeks gestation of pregnancy; Cholestasis during pregnancy in third trimester; and [redacted] weeks gestation of pregnancy on their problem list.  Patient reports  increased vaginal discharge, noticed leaking fluid during & after shower & is wearing pad .  Contractions: Not present. Vag. Bleeding: None.  Movement: Present.   The following portions of the patient's history were reviewed and updated as appropriate: allergies, current medications, past family history, past medical history, past social history, past surgical history and problem list.   Objective:   Vitals:   03/02/23 1419  BP: 120/80  Pulse: 67  Weight: 192 lb (87.1 kg)   Total weight gain: 28 lb (12.7 kg) Fetal Status: Fetal Heart Rate (bpm): 135 (RNST) Fundal Height: 37 cm Movement: Present  Presentation: Transverse (head to maternal right)   General:  Alert, oriented and cooperative. Patient is in no acute distress.  Skin: Skin is warm and dry. No rash noted.   Cardiovascular: Normal heart rate noted  Respiratory: Normal respiratory effort, no problems with respiration noted  Abdomen: Soft, gravid, appropriate for gestational age.  Pain/Pressure: Present     Pelvic:  SSE reveals large amount fluid in vaginal vault. Nitrazine negative  Extremities: Normal range of motion.     Mental Status: Normal mood and affect. Normal behavior. Normal judgment and thought content.   Assessment and Plan:  Pregnancy: G3P2002 at [redacted]w[redacted]d 1. Cholestasis during  pregnancy in third trimester  2. [redacted] weeks gestation of pregnancy  3. Supervision of other normal pregnancy, antepartum   To L&D for evaluation for ROM. Nitrazine negative however given large volume of fluid recommendation made for Mette to be evaluated in L&D. Reviewed transverse presentation with fetal head to maternal right, no presenting part felt in pelvis on [place] & spine appears down on ultrasound. Discussed ECV vs pLTCS. Shaunta at this time expresses desire for pLTCS vs attempt at ECV.  Term labor symptoms and general obstetric precautions including but not limited to vaginal bleeding, contractions, leaking of fluid and fetal movement were reviewed in detail with the patient. Please refer to After Visit Summary for other counseling recommendations.   To L&D for further evaluation, L&D notified & M. Chryl Heck, CNM called & notified.  No future appointments.   Dominica Severin, CNM

## 2023-03-02 NOTE — OB Triage Note (Signed)
Patient is a G3P2002 at [redacted]w[redacted]d who was sent over from the office for rule out ROM. Reports +FM, occasional ctx with pelvic pain, and yellow tinge LOF since this am. Reports +FM, denies vaginal bleeding. Denies sexual intercourse in the last 24 hours. External monitors applied and assessing. Initial FHT 135. Vital signs WDL.

## 2023-03-04 ENCOUNTER — Inpatient Hospital Stay: Payer: Medicaid Other | Admitting: Anesthesiology

## 2023-03-04 ENCOUNTER — Encounter: Payer: Self-pay | Admitting: Obstetrics and Gynecology

## 2023-03-04 ENCOUNTER — Other Ambulatory Visit: Payer: Self-pay

## 2023-03-04 ENCOUNTER — Inpatient Hospital Stay
Admission: AD | Admit: 2023-03-04 | Discharge: 2023-03-06 | DRG: 786 | Disposition: A | Discharge status: Home / Self Care | Payer: Medicaid Other | Source: Ambulatory Visit | Attending: Obstetrics and Gynecology | Admitting: Obstetrics and Gynecology

## 2023-03-04 ENCOUNTER — Encounter
Admission: AD | Disposition: A | Discharge status: Home / Self Care | Payer: Self-pay | Source: Ambulatory Visit | Attending: Obstetrics and Gynecology

## 2023-03-04 DIAGNOSIS — K831 Obstruction of bile duct: Secondary | ICD-10-CM | POA: Diagnosis present

## 2023-03-04 DIAGNOSIS — Z3A37 37 weeks gestation of pregnancy: Secondary | ICD-10-CM

## 2023-03-04 DIAGNOSIS — O26643 Intrahepatic cholestasis of pregnancy, third trimester: Principal | ICD-10-CM | POA: Diagnosis present

## 2023-03-04 DIAGNOSIS — O322XX0 Maternal care for transverse and oblique lie, not applicable or unspecified: Secondary | ICD-10-CM | POA: Diagnosis present

## 2023-03-04 DIAGNOSIS — E559 Vitamin D deficiency, unspecified: Principal | ICD-10-CM

## 2023-03-04 DIAGNOSIS — O9903 Anemia complicating the puerperium: Secondary | ICD-10-CM

## 2023-03-04 DIAGNOSIS — D62 Acute posthemorrhagic anemia: Secondary | ICD-10-CM | POA: Diagnosis not present

## 2023-03-04 DIAGNOSIS — O99012 Anemia complicating pregnancy, second trimester: Secondary | ICD-10-CM

## 2023-03-04 DIAGNOSIS — Z9889 Other specified postprocedural states: Secondary | ICD-10-CM

## 2023-03-04 LAB — CBC
HCT: 34.4 % — ABNORMAL LOW (ref 36.0–46.0)
HCT: 27.1 % — ABNORMAL LOW (ref 36.0–46.0)
Hemoglobin: 11.8 g/dL — ABNORMAL LOW (ref 12.0–15.0)
Hemoglobin: 9.1 g/dL — ABNORMAL LOW (ref 12.0–15.0)
MCH: 32.9 pg (ref 26.0–34.0)
MCH: 32.6 pg (ref 26.0–34.0)
MCHC: 34.3 g/dL (ref 30.0–36.0)
MCHC: 33.6 g/dL (ref 30.0–36.0)
MCV: 95.8 fL (ref 80.0–100.0)
MCV: 97.1 fL (ref 80.0–100.0)
Platelets: 157 10*3/uL (ref 150–400)
Platelets: 146 10*3/uL — ABNORMAL LOW (ref 150–400)
RBC: 3.59 MIL/uL — ABNORMAL LOW (ref 3.87–5.11)
RBC: 2.79 MIL/uL — ABNORMAL LOW (ref 3.87–5.11)
RDW: 14.8 % (ref 11.5–15.5)
RDW: 14.6 % (ref 11.5–15.5)
WBC: 7.5 10*3/uL (ref 4.0–10.5)
WBC: 9.1 10*3/uL (ref 4.0–10.5)
nRBC: 0 % (ref 0.0–0.2)
nRBC: 0 % (ref 0.0–0.2)

## 2023-03-04 LAB — TYPE AND SCREEN
ABO/RH(D): O POS
Antibody Screen: NEGATIVE

## 2023-03-04 SURGERY — Surgical Case
Anesthesia: Spinal

## 2023-03-04 MED ORDER — SCOPOLAMINE 1 MG/3DAYS TD PT72: 1.0000 | MEDICATED_PATCH | Freq: Once | TRANSDERMAL | Status: DC

## 2023-03-04 MED ORDER — IBUPROFEN 600 MG PO TABS
600.0000 mg | ORAL_TABLET | Freq: Four times a day (QID) | ORAL | Status: DC
  Administered 2023-03-05 – 2023-03-06 (×7): 600 mg via ORAL
  Filled 2023-03-04 (×8): qty 1

## 2023-03-04 MED ORDER — KETOROLAC TROMETHAMINE 30 MG/ML IJ SOLN
30.0000 mg | Freq: Four times a day (QID) | INTRAMUSCULAR | Status: AC | PRN
  Administered 2023-03-04: 30 mg via INTRAVENOUS

## 2023-03-04 MED ORDER — PRENATAL MULTIVITAMIN CH
1.0000 | ORAL_TABLET | Freq: Every day | ORAL | Status: DC
  Administered 2023-03-05: 1 via ORAL
  Filled 2023-03-04: qty 1

## 2023-03-04 MED ORDER — DIPHENHYDRAMINE HCL 25 MG PO CAPS: 25.0000 mg | ORAL_CAPSULE | Freq: Four times a day (QID) | ORAL | Status: DC | PRN

## 2023-03-04 MED ORDER — SENNOSIDES-DOCUSATE SODIUM 8.6-50 MG PO TABS
2.0000 | ORAL_TABLET | ORAL | Status: DC
  Administered 2023-03-05 – 2023-03-06 (×2): 2 via ORAL
  Filled 2023-03-04 (×2): qty 2

## 2023-03-04 MED ORDER — PHENYLEPHRINE HCL-NACL 20-0.9 MG/250ML-% IV SOLN
INTRAVENOUS | Status: AC
  Filled 2023-03-04: qty 250

## 2023-03-04 MED ORDER — DIPHENHYDRAMINE HCL 50 MG/ML IJ SOLN: 12.5000 mg | INTRAMUSCULAR | Status: DC | PRN

## 2023-03-04 MED ORDER — SODIUM CHLORIDE 0.9% FLUSH: 3.0000 mL | INTRAVENOUS | Status: DC | PRN

## 2023-03-04 MED ORDER — NALOXONE HCL 4 MG/10ML IJ SOLN: 1.0000 ug/kg/h | INTRAVENOUS | Status: DC | PRN

## 2023-03-04 MED ORDER — MORPHINE SULFATE (PF) 0.5 MG/ML IJ SOLN
INTRAMUSCULAR | Status: AC
  Filled 2023-03-04: qty 10

## 2023-03-04 MED ORDER — ONDANSETRON HCL 4 MG/2ML IJ SOLN
INTRAMUSCULAR | Status: AC
  Filled 2023-03-04: qty 2

## 2023-03-04 MED ORDER — MEPERIDINE HCL 25 MG/ML IJ SOLN: 6.2500 mg | INTRAMUSCULAR | Status: DC | PRN

## 2023-03-04 MED ORDER — NALOXONE HCL 0.4 MG/ML IJ SOLN: 0.4000 mg | INTRAMUSCULAR | Status: DC | PRN

## 2023-03-04 MED ORDER — OXYCODONE HCL 5 MG PO TABS: 5.0000 mg | ORAL_TABLET | Freq: Four times a day (QID) | ORAL | Status: DC | PRN

## 2023-03-04 MED ORDER — KETOROLAC TROMETHAMINE 30 MG/ML IJ SOLN: 30.0000 mg | Freq: Four times a day (QID) | INTRAMUSCULAR | Status: AC | PRN

## 2023-03-04 MED ORDER — TRANEXAMIC ACID-NACL 1000-0.7 MG/100ML-% IV SOLN
1000.0000 mg | Freq: Once | INTRAVENOUS | Status: AC
  Administered 2023-03-04: 1000 mg via INTRAVENOUS
  Filled 2023-03-04: qty 100

## 2023-03-04 MED ORDER — LACTATED RINGERS IV SOLN: INTRAVENOUS | Status: DC

## 2023-03-04 MED ORDER — SIMETHICONE 80 MG PO CHEW
80.0000 mg | CHEWABLE_TABLET | Freq: Four times a day (QID) | ORAL | Status: DC
  Administered 2023-03-04 – 2023-03-06 (×8): 80 mg via ORAL
  Filled 2023-03-04 (×8): qty 1

## 2023-03-04 MED ORDER — METHYLERGONOVINE MALEATE 0.2 MG/ML IJ SOLN
INTRAMUSCULAR | Status: AC
  Administered 2023-03-04: 0.2 mg via INTRAMUSCULAR
  Filled 2023-03-04: qty 1

## 2023-03-04 MED ORDER — ACETAMINOPHEN 500 MG PO TABS
1000.0000 mg | ORAL_TABLET | Freq: Four times a day (QID) | ORAL | Status: DC | PRN
  Administered 2023-03-04 (×2): 1000 mg via ORAL
  Filled 2023-03-04 (×3): qty 2

## 2023-03-04 MED ORDER — ZOLPIDEM TARTRATE 5 MG PO TABS: 5.0000 mg | ORAL_TABLET | Freq: Every evening | ORAL | Status: DC | PRN

## 2023-03-04 MED ORDER — LIDOCAINE 5 % EX PTCH
MEDICATED_PATCH | CUTANEOUS | Status: AC
  Filled 2023-03-04: qty 1

## 2023-03-04 MED ORDER — OXYCODONE-ACETAMINOPHEN 5-325 MG PO TABS
1.0000 | ORAL_TABLET | ORAL | Status: DC | PRN
  Administered 2023-03-05 – 2023-03-06 (×4): 1 via ORAL
  Filled 2023-03-04: qty 2
  Filled 2023-03-04 (×2): qty 1
  Filled 2023-03-04: qty 2
  Filled 2023-03-04: qty 1

## 2023-03-04 MED ORDER — OXYTOCIN-SODIUM CHLORIDE 30-0.9 UT/500ML-% IV SOLN
INTRAVENOUS | Status: AC
  Filled 2023-03-04: qty 1000

## 2023-03-04 MED ORDER — MENTHOL 3 MG MT LOZG: 1.0000 | LOZENGE | OROMUCOSAL | Status: DC | PRN

## 2023-03-04 MED ORDER — OXYTOCIN-SODIUM CHLORIDE 30-0.9 UT/500ML-% IV SOLN
INTRAVENOUS | Status: DC | PRN
  Administered 2023-03-04: 30 [IU] via INTRAVENOUS

## 2023-03-04 MED ORDER — LIDOCAINE HCL (PF) 1 % IJ SOLN
INTRAMUSCULAR | Status: DC | PRN
  Administered 2023-03-04: 3 mL via SUBCUTANEOUS

## 2023-03-04 MED ORDER — CEFAZOLIN SODIUM-DEXTROSE 2-4 GM/100ML-% IV SOLN
2.0000 g | INTRAVENOUS | Status: AC
  Administered 2023-03-04: 2 g via INTRAVENOUS
  Filled 2023-03-04: qty 100

## 2023-03-04 MED ORDER — OXYTOCIN-SODIUM CHLORIDE 30-0.9 UT/500ML-% IV SOLN
2.5000 [IU]/h | INTRAVENOUS | Status: AC
  Administered 2023-03-04: 2.5 [IU]/h via INTRAVENOUS
  Filled 2023-03-04: qty 500

## 2023-03-04 MED ORDER — LACTATED RINGERS IV SOLN
INTRAVENOUS | Status: DC
  Administered 2023-03-04: 500 mL via INTRAVENOUS

## 2023-03-04 MED ORDER — FENTANYL CITRATE (PF) 100 MCG/2ML IJ SOLN
INTRAMUSCULAR | Status: AC
  Filled 2023-03-04: qty 2

## 2023-03-04 MED ORDER — BUPIVACAINE IN DEXTROSE 0.75-8.25 % IT SOLN
INTRATHECAL | Status: DC | PRN
  Administered 2023-03-04: 1.6 mL via INTRATHECAL

## 2023-03-04 MED ORDER — ONDANSETRON HCL 4 MG/2ML IJ SOLN: 4.0000 mg | Freq: Three times a day (TID) | INTRAMUSCULAR | Status: DC | PRN

## 2023-03-04 MED ORDER — SOD CITRATE-CITRIC ACID 500-334 MG/5ML PO SOLN
ORAL | Status: AC
  Filled 2023-03-04: qty 15

## 2023-03-04 MED ORDER — MORPHINE SULFATE (PF) 0.5 MG/ML IJ SOLN
INTRAMUSCULAR | Status: DC | PRN
  Administered 2023-03-04: .1 mg via INTRATHECAL

## 2023-03-04 MED ORDER — PHENYLEPHRINE HCL-NACL 20-0.9 MG/250ML-% IV SOLN
INTRAVENOUS | Status: DC | PRN
  Administered 2023-03-04: 50 ug/min via INTRAVENOUS

## 2023-03-04 MED ORDER — POVIDONE-IODINE 10 % EX SWAB: 2.0000 | Freq: Once | CUTANEOUS | Status: DC

## 2023-03-04 MED ORDER — DIPHENHYDRAMINE HCL 25 MG PO CAPS: 25.0000 mg | ORAL_CAPSULE | ORAL | Status: DC | PRN

## 2023-03-04 MED ORDER — KETOROLAC TROMETHAMINE 30 MG/ML IJ SOLN
INTRAMUSCULAR | Status: AC
  Filled 2023-03-04: qty 1

## 2023-03-04 MED ORDER — FENTANYL CITRATE (PF) 100 MCG/2ML IJ SOLN
INTRAMUSCULAR | Status: DC | PRN
  Administered 2023-03-04: 15 ug via INTRATHECAL

## 2023-03-04 MED ORDER — MISOPROSTOL 200 MCG PO TABS
ORAL_TABLET | ORAL | Status: AC
  Administered 2023-03-04: 800 ug
  Filled 2023-03-04: qty 4

## 2023-03-04 MED ORDER — ONDANSETRON HCL 4 MG/2ML IJ SOLN
INTRAMUSCULAR | Status: DC | PRN
  Administered 2023-03-04: 4 mg via INTRAVENOUS

## 2023-03-04 SURGICAL SUPPLY — 29 items
ADH LQ OCL WTPRF AMP STRL LF (MISCELLANEOUS) ×1
ADHESIVE MASTISOL STRL (MISCELLANEOUS) ×1 IMPLANT
APL PRP STRL LF DISP 70% ISPRP (MISCELLANEOUS) ×2
BAG COUNTER SPONGE SURGICOUNT (BAG) ×1 IMPLANT
BAG SPNG CNTER NS LX DISP (BAG) ×1
CHLORAPREP W/TINT 26 (MISCELLANEOUS) ×2 IMPLANT
DRSG OPSITE POSTOP 4X10 (GAUZE/BANDAGES/DRESSINGS) IMPLANT
DRSG TELFA 3X8 NADH STRL (GAUZE/BANDAGES/DRESSINGS) ×1 IMPLANT
GAUZE SPONGE 4X4 12PLY STRL (GAUZE/BANDAGES/DRESSINGS) ×1 IMPLANT
GLOVE PI ORTHO PRO STRL 7.5 (GLOVE) ×1 IMPLANT
GOWN STRL REUS W/ TWL LRG LVL3 (GOWN DISPOSABLE) ×2 IMPLANT
GOWN STRL REUS W/TWL LRG LVL3 (GOWN DISPOSABLE) ×2
KIT TURNOVER KIT A (KITS) ×1 IMPLANT
MANIFOLD NEPTUNE II (INSTRUMENTS) ×1 IMPLANT
MAT PREVALON FULL STRYKER (MISCELLANEOUS) ×1 IMPLANT
NS IRRIG 1000ML POUR BTL (IV SOLUTION) ×1 IMPLANT
PACK C SECTION AR (MISCELLANEOUS) ×1 IMPLANT
PAD OB MATERNITY 4.3X12.25 (PERSONAL CARE ITEMS) ×1 IMPLANT
PAD PREP OB/GYN DISP 24X41 (PERSONAL CARE ITEMS) ×1 IMPLANT
RETRACTOR WND ALEXIS-O 25 LRG (MISCELLANEOUS) ×1 IMPLANT
RTRCTR WOUND ALEXIS O 25CM LRG (MISCELLANEOUS) ×1
SCRUB CHG 4% DYNA-HEX 4OZ (MISCELLANEOUS) ×1 IMPLANT
SPONGE T-LAP 18X18 ~~LOC~~+RFID (SPONGE) ×1 IMPLANT
SUT VIC AB 0 CTX 36 (SUTURE) ×2
SUT VIC AB 0 CTX36XBRD ANBCTRL (SUTURE) ×2 IMPLANT
SUT VIC AB 1 CT1 36 (SUTURE) ×2 IMPLANT
SUT VICRYL+ 3-0 36IN CT-1 (SUTURE) ×2 IMPLANT
TRAP FLUID SMOKE EVACUATOR (MISCELLANEOUS) ×1 IMPLANT
WATER STERILE IRR 500ML POUR (IV SOLUTION) ×1 IMPLANT

## 2023-03-04 NOTE — Op Note (Signed)
     OP NOTE  Date: 03/04/2023   11:58 AM Name Tammy Contreras MR# [id number]  Preoperative Diagnosis: 1. Intrauterine pregnancy at [redacted]w[redacted]d 2. Cholestasis of pregnancy 3. Unstable fetal lie - transverse  Postoperative Diagnosis: 1. Intrauterine pregnancy at [redacted]w[redacted]d, delivered 2. Viable infant 3. Remainder same as pre-op   Procedure: 1. Primary Low-Transverse Cesarean Section  Surgeon: Elonda Husky, MD  Assistant:  Haynes Kerns,     No other capable assistant was available for this surgery which requires an experienced, high level assistant.   Anesthesia:  Spinal   EBL: 810  ml     Findings: 1) female infant, Apgar scores of 9   at 1 minute and 10   at 5 minutes and a birthweight of 100.18  ounces.    2) Normal uterus, tubes and ovaries.    Procedure:  The patient was prepped and draped in the supine position and placed under spinal anesthesia.  A transverse incision was made across the abdomen in a Pfannenstiel manner. If indicated the old scar was systematically removed with sharp dissection.  We carried the dissection down to the level of the fascia.  The fascia was incised in a curvilinear manner.  The fascia was then elevated from the rectus muscles with blunt and sharp dissection.  The rectus muscles were separated laterally exposing the peritoneum.  The peritoneum was carefully entered with care being taken to avoid bowel and bladder.  A self-retaining retractor was placed.  The visceral peritoneum was incised in a curvilinear fashion across the lower uterine segment creating a bladder flap. A transverse incision was made across the lower uterine segment and extended laterally and superiorly using the bandage scissors.  Artificial rupture membranes was performed and Clear fluid was noted.  The fetal vertex was guided to the pelvis. The infant was delivered from the cephalic position.  A nuchal cord was present. After an appropriate time interval, the cord was doubly  clamped and cut. Cord blood was obtained if required.  The infant was handed to the pediatric personnel  who then placed the infant under heat lamps where it was cleaned dried and suctioned as needed. The placenta was delivered. The hysterotomy incision was then identified on ring forceps.  The uterine cavity was cleaned with a moist lap sponge.  The hysterotomy incision was closed with a running interlocking suture of Vicryl.  Hemostasis was excellent.  Pitocin was run in the IV and the uterus was found to be firm. The posterior cul-de-sac and gutters were cleaned and inspected.  Hemostasis was noted.  The fascia was then closed with a running suture of #1 Vicryl.  Hemostasis of the subcutaneous tissues was obtained using the Bovie.  The subcutaneous tissues were closed with a running suture of 000 Vicryl.  A subcuticular suture was placed.  Steri-strips were applied in the usual manner.  A Lidoderm patch was applied.  A pressure dressing was placed.  The patient went to the recovery room in stable condition. Swanson CNM provided exposure, dissection, suctioning, retraction, and general support and assistance during the procedure.   Elonda Husky, M.D. 03/04/2023 11:58 AM

## 2023-03-04 NOTE — Anesthesia Preprocedure Evaluation (Signed)
Anesthesia Evaluation  Patient identified by MRN, date of birth, ID band Patient awake    Reviewed: Allergy & Precautions, NPO status , Patient's Chart, lab work & pertinent test results  Airway Mallampati: III  TM Distance: >3 FB Neck ROM: full    Dental no notable dental hx.    Pulmonary neg pulmonary ROS   Pulmonary exam normal        Cardiovascular Exercise Tolerance: Good negative cardio ROS Normal cardiovascular exam     Neuro/Psych    GI/Hepatic negative GI ROS,,,  Endo/Other    Renal/GU   negative genitourinary   Musculoskeletal   Abdominal   Peds  Hematology  (+) Blood dyscrasia, anemia   Anesthesia Other Findings Past Medical History: No date: Anemia No date: Asthma  Past Surgical History: No date: WISDOM TOOTH EXTRACTION     Reproductive/Obstetrics (+) Pregnancy                             Anesthesia Physical Anesthesia Plan  ASA: 2  Anesthesia Plan: Spinal   Post-op Pain Management:    Induction:   PONV Risk Score and Plan:   Airway Management Planned: Natural Airway and Nasal Cannula  Additional Equipment:   Intra-op Plan:   Post-operative Plan:   Informed Consent: I have reviewed the patients History and Physical, chart, labs and discussed the procedure including the risks, benefits and alternatives for the proposed anesthesia with the patient or authorized representative who has indicated his/her understanding and acceptance.     Dental Advisory Given  Plan Discussed with: Anesthesiologist  Anesthesia Plan Comments: (Patient reports no bleeding problems and no anticoagulant use.  Plan for spinal with backup GA  Patient consented for risks of anesthesia including but not limited to:  - adverse reactions to medications - damage to eyes, teeth, lips or other oral mucosa - nerve damage due to positioning  - risk of bleeding, infection and or  nerve damage from spinal that could lead to paralysis - risk of headache or failed spinal - damage to teeth, lips or other oral mucosa - sore throat or hoarseness - damage to heart, brain, nerves, lungs, other parts of body or loss of life  Patient voiced understanding.)        Anesthesia Quick Evaluation

## 2023-03-04 NOTE — H&P (Signed)
History and Physical   HPI  Tammy Contreras is a 27 y.o. G3P2002 at [redacted]w[redacted]d Estimated Date of Delivery: 03/20/23 who is being admitted for C-section for cholestasis of pregnancy and and unstable fetal lie - transverse/oblique.  Pt has previously had an ECV and is refusing this.   OB History  OB History  Gravida Para Term Preterm AB Living  3 2 2  0 0 2  SAB IAB Ectopic Multiple Live Births  0 0 0 0 2    # Outcome Date GA Lbr Len/2nd Weight Sex Delivery Anes PTL Lv  3 Current           2 Term 02/22/19 [redacted]w[redacted]d / 00:05 3350 g F Vag-Spont None N LIV     Name: Tammy Contreras     Apgar1: 8  Apgar5: 9  1 Term 08/19/13   3572 g F Vag-Spont None N LIV     Name: Tammy Contreras    PROBLEM LIST  Pregnancy complications or risks: Patient Active Problem List   Diagnosis Date Noted   Labor and delivery indication for care or intervention 03/02/2023   [redacted] weeks gestation of pregnancy 02/23/2023   Cholestasis during pregnancy in third trimester 02/22/2023   Labor and delivery, indication for care 02/05/2023   Acute gastroenteritis 02/05/2023   [redacted] weeks gestation of pregnancy 02/05/2023   Abdominal pain in pregnancy, antepartum 12/20/2022   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/2022   Vitamin D deficiency 08/15/2022   Anemia in pregnancy 08/15/2022   Supervision of other normal pregnancy, antepartum 07/31/2022   History of anemia 02/22/2019   History of asthma 02/22/2019    Prenatal labs and studies: ABO, Rh: --/--/O POS (06/02 0900) Antibody: NEG (06/02 0900) Rubella: 1.42 (11/06 1432) RPR: Non Reactive (04/12 1509)  HBsAg: Negative (11/06 1432)  HIV: Non Reactive (04/12 1509)  ZOX:WRUEAVWU/-- (05/24 1232)   Past Medical History:  Diagnosis Date   Anemia    Asthma      Past Surgical History:  Procedure Laterality Date   WISDOM TOOTH EXTRACTION       Medications    Current Discharge Medication List     CONTINUE these medications which have NOT CHANGED   Details   acetaminophen (TYLENOL) 500 MG tablet Take 500 mg by mouth every 6 (six) hours as needed for moderate pain.    ondansetron (ZOFRAN-ODT) 4 MG disintegrating tablet Take 1 tablet (4 mg total) by mouth every 6 (six) hours as needed for nausea. Qty: 20 tablet, Refills: 0    Prenatal Vit-Fe Fumarate-FA (MULTIVITAMIN-PRENATAL) 27-0.8 MG TABS tablet Take 2 tablets by mouth daily at 12 noon.    ursodiol (ACTIGALL) 500 MG tablet Take 1 tablet (500 mg total) by mouth 2 (two) times daily. Qty: 60 tablet, Refills: 3   Comments: Can substitute 300 mg caps po tid (quantity 90 with three refills) if 500 mg tabs are not available Associated Diagnoses: Cholestasis during pregnancy in third trimester         Allergies  Patient has no known allergies.  Review of Systems  Pertinent items noted in HPI and remainder of comprehensive ROS otherwise negative.  Physical Exam  LMP 05/26/2022 (Exact Date)   Lungs:  CTA B Cardio: RRR without M/R/G Abd: Soft, gravid, NT Presentation: transverse EXT: No C/C/ 1+ Edema DTRs: 2+ B CERVIX:    See Prenatal records for more detailed PE.     FHR:  Variability: Good {> 6 bpm)   Test Results  Results for orders placed  or performed during the hospital encounter of 03/04/23 (from the past 24 hour(s))  CBC     Status: Abnormal   Collection Time: 03/04/23  7:53 AM  Result Value Ref Range   WBC 7.5 4.0 - 10.5 K/uL   RBC 3.59 (L) 3.87 - 5.11 MIL/uL   Hemoglobin 11.8 (L) 12.0 - 15.0 g/dL   HCT 21.3 (L) 08.6 - 57.8 %   MCV 95.8 80.0 - 100.0 fL   MCH 32.9 26.0 - 34.0 pg   MCHC 34.3 30.0 - 36.0 g/dL   RDW 46.9 62.9 - 52.8 %   Platelets 157 150 - 400 K/uL   nRBC 0.0 0.0 - 0.2 %  Type and screen [facility]     Status: None (Preliminary result)   Collection Time: 03/04/23  7:53 AM  Result Value Ref Range   ABO/RH(D) PENDING    Antibody Screen PENDING    Sample Expiration      03/07/2023,2359 Performed at [facility],  [address] [place], [place] [address]   Type and screen     Status: None   Collection Time: 03/04/23  9:00 AM  Result Value Ref Range   ABO/RH(D) O POS    Antibody Screen NEG    Sample Expiration      03/07/2023,2359 Performed at [facility], [address] [place], [place] [address]     Assessment   [id number] at [redacted]w[redacted]d Estimated Date of Delivery: 03/20/23  The fetus is reassuring.   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 Admit to L&D :   2. EFM: -- Category 1 3. Primary CD  Tammy Contreras, M.D. 03/04/2023 10:26 AM

## 2023-03-04 NOTE — Transfer of Care (Signed)
Immediate Anesthesia Transfer of Care Note  Patient: Tammy Contreras  Procedure(s) Performed: CESAREAN SECTION  Patient Location: PACU  Anesthesia Type:Spinal  Level of Consciousness: awake, alert , and oriented  Airway & Oxygen Therapy: Patient Spontanous Breathing  Post-op Assessment: Report given to RN and Post -op Vital signs reviewed and stable  Post vital signs: Reviewed and stable  Last Vitals:  Vitals Value Taken Time  BP 116/77 1200  Temp    Pulse 72   Resp 17   SpO2 99     Last Pain:  Vitals:   03/04/23 0739  PainSc: 0-No pain         Complications: No notable events documented.

## 2023-03-04 NOTE — Lactation Note (Signed)
This note was copied from a baby's chart. Lactation Consultation Note  Patient Name: Boy Yamani Chea ZOXWR'U Date: 03/04/2023 Age:27 hours Reason for consult: Initial assessment;Early term 37-38.6wks;Other (Comment) (Maternal PPH risk factor)  Mother had a PPH of >1.9L EBL. She had a scheduled C/S for transverse presentation. She is still in her L/D room but might move to M/B in a few hours. Infant has nursed now 3 times since birth. Infant had first void/stool during [facility] consult.  Mother has large breasts with large nipples that evert well (left nipple has dimpled center but overall everted). She reports having some difficulty getting infant to open wide enough to latch deeply to breast due to her nipple size. LC reviewed position options to aid in deep latch. Infant had nursed a few mins on the right just prior to [facility] arrival but stimulated with a diaper change to nurse on opposite side. LC advised mother hand express some drops of colostrum prior to feeding. Infant had short feeding on left breast but was still cueing on mother's chest afterwards. With a few attempts, infant was able to latch deeply to the right breast in football hold. Infant sustained latch with audible swallows.   Due to [facility] and possible impact to lactogenesis, LC advised proactive pumping 3-4x/24hrs, especially if infant has inconsistent feedings overnight. Reviewed how this is more for stimulation and she may not be able to collect much colostrum via the pump. Advised prioritizing skin-to-skin, hand expression, and breastfeeding attempts first. Syringes provided for expressed colostrum. Encouraged adequate hydration and nutrition. Symphony pump is set up in M/B room with 27mm flanges (mother's nipples measured 23/74mm at rest). L/D and transition RNs updated on feedings and LC recommendations.   Maternal Data Has patient been taught Hand Expression?: Yes Does the patient have breastfeeding experience prior to this  delivery?: Yes How long did the patient breastfeed?: 8-10 months  Feeding Mother's Current Feeding Choice: Breast Milk  LATCH Score Latch: Repeated attempts needed to sustain latch, nipple held in mouth throughout feeding, stimulation needed to elicit sucking reflex.  Audible Swallowing: Spontaneous and intermittent  Type of Nipple: Everted at rest and after stimulation  Comfort (Breast/Nipple): Soft / non-tender  Hold (Positioning): Assistance needed to correctly position infant at breast and maintain latch.  LATCH Score: 8   Lactation Tools Discussed/Used Tools: Pump;Flanges Flange Size: 27 Breast pump type: Double-Electric Breast Pump Pump Education: Setup, frequency, and cleaning;Milk Storage Reason for Pumping: PPH (proactive pumping due to excessive blood loss) Pumping frequency: recommended 3-4x/24hrs Pumped volume:  (set-up, has not started)  Interventions Interventions: Breast feeding basics reviewed;Skin to skin;Assisted with latch;Hand express;Adjust position;Support pillows;Position options;DEBP (Pump set up in PP room, mother still in L/D room with expected transfer in 1-2 hrs)  Discharge Pump: Personal;DEBP  Consult Status Consult Status: Follow-up    Queen Slough 03/04/2023, 4:57 PM

## 2023-03-04 NOTE — Progress Notes (Signed)
Called to bedside for heavy vaginal bleeding. Methergine was administered prior to my arrival. Large amount of blood and small clots noted on pad beneath Tammy Contreras. Fundus was firm but a moderate amount of bright red blood and small clots were expressed with fundal massage. 800 mcg of misoprostol was placed rectally. IV fluid bolus infusing. Fundus was massaged again and found to be firm with minimal bleeding. QBL 900 mL for a total of 1710 mL includong surgical blood loss, CBC ordered for this evening. Monitor symptoms and consider blood transfusion.  Glenetta Borg, CNM 03/04/23  3:24 PM

## 2023-03-04 NOTE — Progress Notes (Signed)
Called to bedside to assess bleeding. Tammy Contreras had another gush of 190 mL. Fundus is firm. TXA ordered. Discussed blood transfusion at this time. Nivia declines blood transfusion at this time but will consider it if she continues to lose blood, feels symptomatic, or based on CBC results. She understands that it is my recommendation that she receive a transfusion at this time. Dr. Logan Bores updated.  Glenetta Borg, CNM

## 2023-03-04 NOTE — Anesthesia Procedure Notes (Addendum)
Spinal  Patient location during procedure: OR Start time: 03/04/2023 10:28 AM End time: 03/04/2023 10:29 AM Reason for block: surgical anesthesia Staffing Performed: resident/CRNA  Anesthesiologist: Louie Boston, MD Resident/CRNA: Karoline Caldwell, CRNA Performed by: Karoline Caldwell, CRNA Authorized by: Louie Boston, MD   Preanesthetic Checklist Completed: patient identified, IV checked, site marked, risks and benefits discussed, surgical consent, monitors and equipment checked, pre-op evaluation and timeout performed Spinal Block Patient position: sitting Prep: DuraPrep Patient monitoring: heart rate, cardiac monitor, continuous pulse ox and blood pressure Approach: midline Location: L3-4 Injection technique: single-shot Needle Needle type: Sprotte  Needle gauge: 24 G Needle length: 9 cm Assessment Sensory level: T4 Events: CSF return

## 2023-03-05 ENCOUNTER — Encounter: Payer: Self-pay | Admitting: Obstetrics and Gynecology

## 2023-03-05 ENCOUNTER — Encounter: Payer: Self-pay | Admitting: Certified Nurse Midwife

## 2023-03-05 ENCOUNTER — Other Ambulatory Visit: Payer: Self-pay

## 2023-03-05 LAB — RPR: RPR Ser Ql: NONREACTIVE

## 2023-03-05 LAB — CBC
HCT: 25 % — ABNORMAL LOW (ref 36.0–46.0)
Hemoglobin: 8.4 g/dL — ABNORMAL LOW (ref 12.0–15.0)
MCH: 32.7 pg (ref 26.0–34.0)
MCHC: 33.6 g/dL (ref 30.0–36.0)
MCV: 97.3 fL (ref 80.0–100.0)
Platelets: 152 10*3/uL (ref 150–400)
RBC: 2.57 MIL/uL — ABNORMAL LOW (ref 3.87–5.11)
RDW: 14.8 % (ref 11.5–15.5)
WBC: 9 10*3/uL (ref 4.0–10.5)
nRBC: 0 % (ref 0.0–0.2)

## 2023-03-05 MED ORDER — SODIUM CHLORIDE 0.9 % IV SOLN: 25.0000 mg | Freq: Once | INTRAVENOUS | Status: DC

## 2023-03-05 MED ORDER — SODIUM CHLORIDE 0.9 % IV SOLN: 1000.0000 mg | Freq: Once | INTRAVENOUS | Status: DC

## 2023-03-05 MED ORDER — SODIUM CHLORIDE 0.9 % IV SOLN
300.0000 mg | Freq: Once | INTRAVENOUS | Status: AC
  Administered 2023-03-05: 300 mg via INTRAVENOUS
  Filled 2023-03-05: qty 300

## 2023-03-05 NOTE — Progress Notes (Signed)
Progress Note - Cesarean Delivery  Tammy Contreras is a 27 y.o. G3P3003 now PP day 1 s/p LTCD for unstable fetal lie.   Subjective:  Patient reports no problems with eating, bowel movements, voiding, or their wound  She is not lightheaded despite increase QBL  Pain controlled  Objective:  Vital signs in last 24 hours: Temp:  [97.9 F (36.6 C)-98.4 F (36.9 C)] 98 F (36.7 C) (06/03 0818) Pulse Rate:  [56-160] 72 (06/03 0818) Resp:  [12-23] 18 (06/03 0818) BP: (91-127)/(56-88) 116/79 (06/03 0818) SpO2:  [92 %-100 %] 98 % (06/03 0818)  Physical Exam:  General: alert, cooperative, fatigued, and no distress Lochia: appropriate Uterine Fundus: firm Incision: Dressing intact    Data Review Recent Labs    03/04/23 1911 03/05/23 0600  HGB 9.1* 8.4*  HCT 27.1* 25.0*    Assessment:  Principal Problem:   Post-operative state   Status post Cesarean section. Doing well postoperatively.     Plan:       Continue current care.  Iron infusion today  Prob discharge tomorrow.  Elonda Husky, M.D. 03/05/2023 9:51 AM

## 2023-03-05 NOTE — Discharge Summary (Signed)
Physician Obstetric Discharge Summary  Patient Name: Tammy Contreras DOB: [birth year] MRN: [id number]                            Discharge Summary  Date of Admission: 03/04/2023 Date of Discharge: 03/06/2023 Delivering Provider: Linzie Collin   Admitting Diagnosis: Post-operative state [Z98.890] at [redacted]w[redacted]d Secondary diagnosis:  Principal Problem:   Post-operative state Active Problems:   Postpartum care following cesarean delivery   Encounter for care or examination of lactating mother Cholestasis of prepregnancy Unstable fetal lie (transverse) Mode of Delivery:       low uterine, transverse     Discharge diagnosis: Term Pregnancy Delivered     And Same as above    Post partum procedures: Methergine and TXA - POD 1 = Iron infusion  Complications: none                     Discharge Day SOAP Note:  Subjective: The patient has no complaints.  She is ambulating well. She is taking PO well. Pain is well controlled with current medications. Patient is urinating without difficulty.   She is passing flatus.  She reports breastfeeding is going well. She is also supplementing with formula due to newborn weight loss.   Objective  Vital signs: BP 114/67 (BP Location: Left Arm)   Pulse 78   Temp 98.5 F (36.9 C) (Oral)   Resp 20   LMP 05/26/2022 (Exact Date)   SpO2 95%   Breastfeeding Unknown   Physical Exam: Gen: NAD Incision:clean, dry, no drainage, Honeycomb dressing intact Fundus Fundal Tone: Firm  Lochia Amount: Scant     Data Review Labs: Lab Results  Component Value Date   WBC 9.0 03/05/2023   HGB 8.4 (L) 03/05/2023   HCT 25.0 (L) 03/05/2023   MCV 97.3 03/05/2023   PLT 152 03/05/2023      Latest Ref Rng & Units 03/05/2023    6:00 AM 03/04/2023    7:11 PM 03/04/2023    7:53 AM  CBC  WBC 4.0 - 10.5 K/uL 9.0  9.1  7.5   Hemoglobin 12.0 - 15.0 g/dL 8.4  9.1  78.2   Hematocrit 36.0 - 46.0 % 25.0  27.1  34.4   Platelets 150 - 400 K/uL 152  146  157     O POS  Edinburgh Score:    03/05/2023   12:30 PM  Edinburgh Postnatal Depression Scale Screening Tool  I have been able to laugh and see the funny side of things. 0  I have looked forward with enjoyment to things. 0  I have blamed myself unnecessarily when things went wrong. 0  I have been anxious or worried for no good reason. 0  I have felt scared or panicky for no good reason. 2  Things have been getting on top of me. 1  I have been so unhappy that I have had difficulty sleeping. 2  I have felt sad or miserable. 0  I have been so unhappy that I have been crying. 0  The thought of harming myself has occurred to me. 0  Edinburgh Postnatal Depression Scale Total 5    Assessment:  Principal Problem:   Post-operative state Active Problems:   Postpartum care following cesarean delivery   Encounter for care or examination of lactating mother   Doing well.  Normal progress as expected.  Plan:  Discharge to home  Modified rest as  directed - may slowly resume normal activities with restrictions  as discussed.  Medications as written.  See below for additional.      Discharge Instructions: Per After Visit Summary. Activity: Advance as tolerated. Pelvic rest for 6 weeks.  Also refer to After Visit Summary.  Wound care discussed. Diet: Regular Medications: Allergies as of 03/06/2023   No Known Allergies      Medication List     STOP taking these medications    acetaminophen 500 MG tablet Commonly known as: TYLENOL   ondansetron 4 MG disintegrating tablet Commonly known as: ZOFRAN-ODT       TAKE these medications    ibuprofen 600 MG tablet Commonly known as: ADVIL Take 1 tablet (600 mg total) by mouth every 6 (six) hours.   multivitamin-prenatal 27-0.8 MG Tabs tablet Take 2 tablets by mouth daily at 12 noon.   oxyCODONE 5 MG immediate release tablet Commonly known as: Oxy IR/ROXICODONE Take 1 tablet (5 mg total) by mouth every 6 (six) hours as needed for up  to 5 days for severe pain.   ursodiol 500 MG tablet Commonly known as: ACTIGALL Take 1 tablet (500 mg total) by mouth 2 (two) times daily.               Discharge Care Instructions  (From admission, onward)           Start     Ordered   03/06/23 0000  Discharge wound care:       Comments: Keep incision dry, clean.   03/06/23 1550           Outpatient follow up:   Follow-up Information     Linzie Collin, MD Follow up in 1 week(s).   Specialties: Obstetrics and Gynecology, Radiology Contact information: [address] [place] [address] [phone]                Postpartum contraception: Will discuss at first post-partum visit.   Discharged Condition: good  Discharged to: home  Newborn Data: Disposition:home with mother  Apgars: APGAR (1 MIN): 9   APGAR (5 MINS): 10   APGAR (10 MINS):    Baby Feeding:  breast and formula  Elonda Husky, M.D. 03/06/2023 3:51 PM

## 2023-03-05 NOTE — Lactation Note (Signed)
This note was copied from a baby's chart. Lactation Consultation Note  Patient Name: Tammy Contreras ZOXWR'U Date: 03/05/2023 Age:27 hours Reason for consult: Follow-up assessment   Maternal Data    Feeding Mother's Current Feeding Choice: Breast Milk BAby very fussy, just recently nursed, mom wanted to sit on edge of bed and feed in right football hold, baby fussy at breast, latches to nipple and then pulls off and cries, doesn't get all of nipple, mom back to bed, pre pump with Symphony pump in room 27 mm shield, elongated nipple for deeper latch as baby tends to push nipple out with tongue, latched easier in right cradle hold and maintained latch, mom shown how to flange lips at breast and provide chin pressure to widen latch, swallows heard, baby nursed x 12 min. LATCH Score Latch: Repeated attempts needed to sustain latch, nipple held in mouth throughout feeding, stimulation needed to elicit sucking reflex.  Audible Swallowing: Spontaneous and intermittent  Type of Nipple: Everted at rest and after stimulation  Comfort (Breast/Nipple): Soft / non-tender  Hold (Positioning): Assistance needed to correctly position infant at breast and maintain latch.  LATCH Score: 8   Lactation Tools Discussed/Used Breast pump type: Double-Electric Breast Pump Pump Education: Setup, frequency, and cleaning;Milk Storage Reason for Pumping: to shape nipples for easier latch Pumping frequency: prepump, prn Pumped volume:  (drops)  Interventions Interventions: Assisted with latch;Pre-pump if needed;Adjust position;Support pillows;Education  Discharge    Consult Status Consult Status: Follow-up Date: 03/06/23 Follow-up type: In-patient    Tammy Contreras 03/05/2023, 6:23 PM

## 2023-03-05 NOTE — Anesthesia Postprocedure Evaluation (Signed)
Anesthesia Post Note  Patient: Tammy Contreras  Procedure(s) Performed: CESAREAN SECTION  Patient location during evaluation: Mother Baby Anesthesia Type: Spinal Level of consciousness: oriented and awake and alert Pain management: pain level controlled Vital Signs Assessment: post-procedure vital signs reviewed and stable Respiratory status: spontaneous breathing and respiratory function stable Cardiovascular status: blood pressure returned to baseline and stable Postop Assessment: no headache, no backache, no apparent nausea or vomiting and able to ambulate Anesthetic complications: no   No notable events documented.   Last Vitals:  Vitals:   03/05/23 0326 03/05/23 0500  BP: 120/79   Pulse: 76 64  Resp: 18   Temp: 36.6 C   SpO2: 99% 92%    Last Pain:  Vitals:   03/05/23 0352  TempSrc:   PainSc: 2                  Quenten Nawaz Joanette Gula

## 2023-03-05 NOTE — Anesthesia Post-op Follow-up Note (Signed)
  Anesthesia Pain Follow-up Note  Patient: Tammy Contreras  Day #: 1  Date of Follow-up: 03/05/2023 Time: 7:41 AM  Last Vitals:  Vitals:   03/05/23 0326 03/05/23 0500  BP: 120/79   Pulse: 76 64  Resp: 18   Temp: 36.6 C   SpO2: 99% 92%    Level of Consciousness: alert  Pain: none   Side Effects:None  Catheter Site Exam:clean, dry     Plan: D/C from anesthesia care at surgeon's request  Desi Carby

## 2023-03-05 NOTE — Lactation Note (Signed)
This note was copied from a baby's chart. Lactation Consultation Note  Patient Name: Boy Kennita Schleiger [id number] Date: 03/05/2023 Age:26 hours Reason for consult: Follow-up assessment;Early term 37-38.6wks   Maternal Data Does the patient have breastfeeding experience prior to this delivery?: Yes How long did the patient breastfeed?: 8-10 mths  Feeding Mother's Current Feeding Choice: Breast Milk Called to room to help mom with latch, but mom already had baby latched in cradle hold to right breast. Baby currently nursing well with some swallows noted.  Encouraged mom to offer both breasts at a feeding to assist with milk production in that she had a PPH LATCH Score Latch: Grasps breast easily, tongue down, lips flanged, rhythmical sucking.  Audible Swallowing: A few with stimulation  Type of Nipple: Everted at rest and after stimulation (large nipples)  Comfort (Breast/Nipple): Soft / non-tender  Hold (Positioning): No assistance needed to correctly position infant at breast.  LATCH Score: 9   Lactation Tools Discussed/Used  LC name and no wriitten  on white board and encouraged to call for assist as needed  Interventions Interventions: Breast feeding basics reviewed;Skin to skin;Education  Discharge Pump: Personal WIC Program: Yes  Consult Status Consult Status: PRN Date: 03/05/23 Follow-up type: In-patient    Dyann Kief 03/05/2023, 1:27 PM

## 2023-03-06 MED ORDER — IBUPROFEN 600 MG PO TABS: 600.0000 mg | ORAL_TABLET | Freq: Four times a day (QID) | ORAL | 0 refills | Status: DC

## 2023-03-06 MED ORDER — OXYCODONE HCL 5 MG PO TABS: 5.0000 mg | ORAL_TABLET | Freq: Four times a day (QID) | ORAL | 0 refills | Status: AC | PRN

## 2023-03-06 NOTE — Discharge Instructions (Signed)

## 2023-03-09 ENCOUNTER — Emergency Department
Admission: EM | Admit: 2023-03-09 | Discharge: 2023-03-09 | Disposition: A | Discharge status: Home / Self Care | Payer: Medicaid Other | Attending: Emergency Medicine | Admitting: Emergency Medicine

## 2023-03-09 ENCOUNTER — Encounter: Payer: Self-pay | Admitting: Obstetrics and Gynecology

## 2023-03-09 ENCOUNTER — Emergency Department: Payer: Medicaid Other

## 2023-03-09 DIAGNOSIS — O9081 Anemia of the puerperium: Secondary | ICD-10-CM | POA: Diagnosis present

## 2023-03-09 DIAGNOSIS — R1030 Lower abdominal pain, unspecified: Secondary | ICD-10-CM | POA: Insufficient documentation

## 2023-03-09 DIAGNOSIS — Z9889 Other specified postprocedural states: Secondary | ICD-10-CM

## 2023-03-09 LAB — COMPREHENSIVE METABOLIC PANEL
ALT: 39 U/L (ref 0–44)
AST: 37 U/L (ref 15–41)
Albumin: 2.5 g/dL — ABNORMAL LOW (ref 3.5–5.0)
Alkaline Phosphatase: 98 U/L (ref 38–126)
Anion gap: 12 (ref 5–15)
BUN: 16 mg/dL (ref 6–20)
CO2: 23 mmol/L (ref 22–32)
Calcium: 8.2 mg/dL — ABNORMAL LOW (ref 8.9–10.3)
Chloride: 102 mmol/L (ref 98–111)
Creatinine, Ser: 0.57 mg/dL (ref 0.44–1.00)
GFR, Estimated: >60 mL/min (ref >60)
Glucose, Bld: 109 mg/dL — ABNORMAL HIGH (ref 70–99)
Potassium: 4.4 mmol/L (ref 3.5–5.1)
Sodium: 137 mmol/L (ref 135–145)
Total Bilirubin: 0.8 mg/dL (ref 0.3–1.2)
Total Protein: 5.9 g/dL — ABNORMAL LOW (ref 6.5–8.1)

## 2023-03-09 LAB — URINALYSIS, COMPLETE (UACMP) WITH MICROSCOPIC
Bilirubin Urine: NEGATIVE
Glucose, UA: NEGATIVE mg/dL
Ketones, ur: NEGATIVE mg/dL
Nitrite: NEGATIVE
Protein, ur: 100 mg/dL — AB
RBC / HPF: >50 RBC/hpf (ref 0–5)
Specific Gravity, Urine: 1.012 (ref 1.005–1.030)
WBC, UA: >50 WBC/hpf (ref 0–5)
pH: 7 (ref 5.0–8.0)

## 2023-03-09 LAB — CBC WITH DIFFERENTIAL/PLATELET
Abs Immature Granulocytes: 0.3 10*3/uL — ABNORMAL HIGH (ref 0.00–0.07)
Basophils Absolute: 0 10*3/uL (ref 0.0–0.1)
Basophils Relative: 0 %
Eosinophils Absolute: 0.2 10*3/uL (ref 0.0–0.5)
Eosinophils Relative: 3 %
HCT: 23.3 % — ABNORMAL LOW (ref 36.0–46.0)
Hemoglobin: 7.5 g/dL — ABNORMAL LOW (ref 12.0–15.0)
Immature Granulocytes: 4 %
Lymphocytes Relative: 13 %
Lymphs Abs: 0.9 10*3/uL (ref 0.7–4.0)
MCH: 33 pg (ref 26.0–34.0)
MCHC: 32.2 g/dL (ref 30.0–36.0)
MCV: 102.6 fL — ABNORMAL HIGH (ref 80.0–100.0)
Monocytes Absolute: 0.4 10*3/uL (ref 0.1–1.0)
Monocytes Relative: 5 %
Neutro Abs: 5.1 10*3/uL (ref 1.7–7.7)
Neutrophils Relative %: 75 %
Platelets: 235 10*3/uL (ref 150–400)
RBC: 2.27 MIL/uL — ABNORMAL LOW (ref 3.87–5.11)
RDW: 15.5 % (ref 11.5–15.5)
WBC: 6.9 10*3/uL (ref 4.0–10.5)
nRBC: 1.2 % — ABNORMAL HIGH (ref 0.0–0.2)

## 2023-03-09 LAB — LACTIC ACID, PLASMA
Lactic Acid, Venous: 2.3 mmol/L (ref 0.5–1.9)
Lactic Acid, Venous: 1.1 mmol/L (ref 0.5–1.9)

## 2023-03-09 MED ORDER — IOHEXOL 300 MG/ML  SOLN
100.0000 mL | Freq: Once | INTRAMUSCULAR | Status: AC | PRN
  Administered 2023-03-09: 100 mL via INTRAVENOUS

## 2023-03-09 MED ORDER — SODIUM CHLORIDE 0.9 % IV BOLUS
1000.0000 mL | Freq: Once | INTRAVENOUS | Status: AC
  Administered 2023-03-09: 1000 mL via INTRAVENOUS

## 2023-03-09 NOTE — ED Triage Notes (Signed)
Pt presents to the ED due to C section incision drainage. Pt states she noticed it after a shower. Pt had a C section on Sunday 03/04/2023.  Pt A&Ox4

## 2023-03-09 NOTE — Telephone Encounter (Signed)
Pt aware.

## 2023-03-09 NOTE — ED Provider Notes (Signed)
[facility] Provider Note    Event Date/Time   First MD Initiated Contact with Patient 03/09/23 2008     (approximate)  History   Chief Complaint: Post-op Problem  HPI  Tammy Contreras is a 27 y.o. female with a past medical history of anemia who presents to the emergency department 5 days status post C-section for incisional drainage.  According to the patient she had a C-section 5 days ago.  States she was in the shower today when her husband noted some discoloration to her bandage concerning for incisional drainage.  States she sent a picture to Dr. Logan Bores (her OB/GYN) who requested that she come to the emergency department for evaluation.  Patient denies any fever, reassuring vitals.  States mild lower abdominal pain although denies any increase in pain since the C-section.  States very minimal vaginal bleeding/discharge.  Physical Exam   Triage Vital Signs: ED Triage Vitals  Enc Vitals Group     BP 03/09/23 1843 135/89     Pulse Rate 03/09/23 1843 97     Resp 03/09/23 1843 18     Temp 03/09/23 1843 98.4 F (36.9 C)     Temp Source 03/09/23 1843 Oral     SpO2 03/09/23 1843 100 %     Weight 03/09/23 1849 192 lb (87.1 kg)     Height --      Head Circumference --      Peak Flow --      Pain Score 03/09/23 1849 8     Pain Loc --      Pain Edu? --      Excl. in GC? --     Most recent vital signs: Vitals:   03/09/23 1843  BP: 135/89  Pulse: 97  Resp: 18  Temp: 98.4 F (36.9 C)  SpO2: 100%    General: Awake, no distress.  CV:  Good peripheral perfusion.  Regular rate and rhythm  Resp:  Normal effort.  Equal breath sounds bilaterally.  Abd:  No distention.  Soft, mild lower abdominal/suprapubic tenderness.  Incision appears intact there is a very mild amount of clear to yellow drainage on the dressing.  No obvious abscess or hematoma palpated.  No wound dehiscence.  ED Results / Procedures / Treatments   RADIOLOGY  I have reviewed  and interpreted the CT images.  Patient appears to have a large uterus but I do not see any obvious significant abnormality nor any obvious sign of abscess around the incision. Radiology has read the CT has an enlarged uterus consistent with postpartum state.  Heterogeneous appearing endometrium with vaginal gas possibly indicating infection.   MEDICATIONS ORDERED IN ED: Medications  sodium chloride 0.9 % bolus 1,000 mL (has no administration in time range)     IMPRESSION / MDM / ASSESSMENT AND PLAN / ED COURSE  I reviewed the triage vital signs and the nursing notes.  Patient's presentation is most consistent with acute presentation with potential threat to life or bodily function.  Patient presents emergency department for concern over a wound infection/drainage.  Patient was referred by her OB/GYN for further evaluation.  Here the patient lab work does show mild lactic elevation to 2.3, chemistry is reassuring, CBC does show anemia which appears to be downtrending over the past 5 days currently 7.5.  Will obtain CT imaging IV hydrate and repeat the lactate.  We will discuss with OB/GYN once we have CT results.  Patient's workup shows fairly significant anemia downtrending  but not quite to the level of requiring transfusion.  CT scan is resulted showing concern for heterogeneous appearing endometrium which could represent possibly infection or residual products.  I spoke to the [facility] midwife who is also spoken to Dr. Elza Rafter regarding this patient Dr. Billey Gosling has reviewed the patient as well as a CT scan per midwife and does not believe that there is anything concerning on the CT scan or lab work.  Does not believe that the patient needs to be on antibiotics.  Also states that at the hemoglobin of 7.5 they would not admit to transfuse, and stated that the patient received an iron transfusion prior to discharge from the hospital.  They did state they will send Dr. Logan Bores a note to have the patient  follow-up in the office next week to recheck her hemoglobin.  We will send a urine culture on the patient's urinalysis although she denies any urinary symptoms we will hold off on antibiotics as this could very likely be due to the recent C-section/postpartum state.  Patient agreeable to plan of care.  As an incidental finding there is a small lesion within the left lobe of the liver.  Patient will follow-up with her doctor regarding MRI if warranted.  FINAL CLINICAL IMPRESSION(S) / ED DIAGNOSES   Incisional drainage Anemia    Note:  This document was prepared using Dragon voice recognition software and may include unintentional dictation errors.   Minna Antis, MD 03/09/23 2300

## 2023-03-09 NOTE — Discharge Instructions (Addendum)
As we discussed please follow-up with your OB/GYN next week for recheck of your hemoglobin level.  Return to the emergency department for any chest pain significant shortness of breath with exertion, significant weakness or if you pass out or feel like you are going to pass out, or any other symptom personally concerning to yourself.  As we discussed your liver did show a small lesion, we would recommend you follow-up with your doctor to arrange a nonemergent MRI to further evaluate.  Return to the emergency department or see your OB/GYN sooner if you develop any fever abdominal pain or significant vaginal discharge/bleeding.

## 2023-03-09 NOTE — ED Notes (Signed)
Pt is up to the toilet. Husband at bedside.

## 2023-03-09 NOTE — ED Notes (Signed)
Pt taken to the room via wheelchair and attached to the monitor; primary RN made aware of the patients arrival.    

## 2023-03-12 ENCOUNTER — Encounter: Payer: Medicaid Other | Admitting: Obstetrics

## 2023-03-12 ENCOUNTER — Ambulatory Visit (INDEPENDENT_AMBULATORY_CARE_PROVIDER_SITE_OTHER): Payer: Medicaid Other

## 2023-03-12 NOTE — Progress Notes (Signed)
HPI:      Ms. Baila Rouse is a 27 y.o. [phone]  who is POD#8 s/p LTCS. She is following up today after being seen in the ED on 6/7 for evaluation of drainage at her incision site. At that time, no infection was noted; however, her hemoglobin levels were downtrending from levels prior to discharge after her CS, and it was recommended that she have follow up labs. She is s/p an iron transfusion in the hospital on 03/05/23.  Subjective:   She presents today for a 1 week post-partum visit and incision check. She reports that she is still sore at the incision site and is taking ibuprofen regularly which helps manage the pain. She denies noting any further drainage through the bandage that was placed on her incision in the ED. She is having minimal lochia with occasional small gushes. She is breastfeeding without issues now that her milk has come in which was initially delayed.   She also reports occasional numbness in her left leg that is getting less noticeable with time.     The following portions of the patient's history were reviewed and updated as appropriate: Hx: She  has a past medical history of Anemia and Asthma. She does not have any pertinent problems on file. She  has a past surgical history that includes Wisdom tooth extraction and Cesarean section (N/A, 03/04/2023). She has a current medication list which includes the following prescription(s): acetaminophen, ibuprofen, OVER THE COUNTER MEDICATION, multivitamin-prenatal, and ursodiol.                   Review of Systems:  Review of Systems  Constitutional: Denied constitutional symptoms, night sweats, recent illness, fatigue, fever, insomnia and weight loss.  Genitourinary: Denied genitourinary symptoms including symptomatic vaginal discharge, pelvic relaxation issues, and urinary complaints.  Musculoskeletal: Reports abdominal soreness and tenderness around incision site as well as intermittent numbness in left leg.   Neurologic: Denied neurology symptoms, dizziness, headache, neck pain and syncope.  Psychiatric: Denied psychiatric symptoms, anxiety and depression.   Review the Delivery Report for details.   Meds:   Current Outpatient Medications on File Prior to Visit  Medication Sig Dispense Refill   acetaminophen (TYLENOL) 500 MG tablet Take 500 mg by mouth every 6 (six) hours as needed for moderate pain.     ibuprofen (ADVIL) 600 MG tablet Take 1 tablet (600 mg total) by mouth every 6 (six) hours. 30 tablet 0   OVER THE COUNTER MEDICATION Take 1 capsule by mouth as directed. "MILK SUPPLY PILLS"     Prenatal Vit-Fe Fumarate-FA (MULTIVITAMIN-PRENATAL) 27-0.8 MG TABS tablet Take 3 tablets by mouth daily at 12 noon.     ursodiol (ACTIGALL) 500 MG tablet Take 1 tablet (500 mg total) by mouth 2 (two) times daily. 60 tablet 3   No current facility-administered medications on file prior to visit.    Objective:     Vitals:   03/12/23 1504  BP: 119/79  Pulse: 96   Abdomen: Soft.  Non-tender.  No masses.  No HSM.  Incision/s: Intact.  Healing well.  No erythema.  No drainage. Bandage removed.  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 Postpartum care following cesarean delivery   Incision -Normal healing process of CS incision.  2. Anemia - Iron transfusion prior to discharge from hospital.  Downtrending hemoglobin levels at ED visit POD#5.   3. Numbness in left  extremity - Patient description of numbness is consistent with post-epidural numbness.    Plan:           1) Routine postpartum care s/p LTCS. Plan for 6 week in person appointment.  2) CBC collected today. Will follow up as needed with plan based on results. 3) Recommended to continue to pay attention to sensation of numbness in left leg and if it does not resolve by 6 week postpartum appointment, to discuss at that time.   Orders Orders Placed This Encounter  Procedures   CBC          F/U 6 week postpartum visit  Autumn Messing, [place] 03/12/2023 3:31 PM

## 2023-03-13 ENCOUNTER — Encounter: Payer: Medicaid Other | Admitting: Obstetrics and Gynecology

## 2023-03-13 LAB — CBC
Hematocrit: 27 % — ABNORMAL LOW (ref 34.0–46.6)
Hemoglobin: 8.9 g/dL — ABNORMAL LOW (ref 11.1–15.9)
MCH: 32.2 pg (ref 26.6–33.0)
MCHC: 33 g/dL (ref 31.5–35.7)
MCV: 98 fL — ABNORMAL HIGH (ref 79–97)
NRBC: 1 % — ABNORMAL HIGH (ref 0–0)
Platelets: 287 10*3/uL (ref 150–450)
RBC: 2.76 x10E6/uL — ABNORMAL LOW (ref 3.77–5.28)
RDW: 14.9 % (ref 11.7–15.4)
WBC: 6.3 10*3/uL (ref 3.4–10.8)

## 2023-03-20 ENCOUNTER — Telehealth: Payer: Self-pay

## 2023-03-20 NOTE — Telephone Encounter (Signed)
[facility]- Discharge Call Backs-Left Voicemail about the following below. 1-Do you have any questions or concerns about yourself as you heal? 2-Any concerns or questions about your baby? 3-How was your stay at the hospital? 4- Did our team work together to care for you? You should be receiving a survey in the mail soon.   We would really appreciate it if you could fill that out for us and return it in the mail.  We value the feedback to make improvements and continue the great work we do.   If you have any questions please feel free to call me back at [phone]  

## 2023-03-27 ENCOUNTER — Encounter: Payer: Self-pay | Admitting: Obstetrics and Gynecology

## 2023-03-27 ENCOUNTER — Telehealth (INDEPENDENT_AMBULATORY_CARE_PROVIDER_SITE_OTHER): Payer: Medicaid Other | Admitting: Obstetrics and Gynecology

## 2023-03-27 NOTE — Progress Notes (Signed)
Virtual Visit via Video Note  I connected with Tammy Contreras on 03/27/23 at  7:55 AM EDT by video and verified that I was speaking with the correct person using two identifiers.    Tammy Contreras is a 27 y.o. [phone] who LMP was Patient's last menstrual period was 05/26/2022 (exact date). I discussed the limitations, risks, security and privacy concerns of performing an evaluation and management service by video and the availability of in person appointments. I also discussed with the patient that there may be a patient responsible charge related to this service. The patient expressed understanding and agreed to proceed.  Location of patient: Home  Patient gave explicit verbal consent for video visit:  YES  Location of provider:  [facility] office  Persons other than physician and patient involved in provider conference:  None   Subjective:   History of Present Illness:    Patient underwent cesarean delivery approximately 3 weeks ago.  She was seen in the ED for problems with her incision and she did have a follow-up appointment in the office for an incision check.  She says her incision is doing well.  She is having minimal pain from it.  She has stopped pain medication.  She did have some problem with her leg and reports that she had numbness and pain in her leg especially last week.  It was so bad that she could not walk.  Since that time it has improved and she is thinks that it is generally improving.  She had some issues with the baby and weight loss.  The baby was readmitted to the hospital and is now doing much better.  She is pumping full-time and bottlefeeding with supplementation.  Hx: The following portions of the patient's history were reviewed and updated as appropriate:             She  has a past medical history of Anemia and Asthma. She does not have any pertinent problems on file. She  has a past surgical history that includes Wisdom tooth extraction and  Cesarean section (N/A, 03/04/2023). Her family history includes Asthma in her brother; Cataracts in her paternal grandfather; Diabetes in her maternal grandfather, mother, and paternal grandfather; Healthy in her father, paternal grandmother, and sister; Hyperlipidemia in her mother and paternal grandfather; Hypertension in her maternal grandfather, mother, and paternal grandfather. She  reports that she has never smoked. She has never used smokeless tobacco. She reports that she does not currently use alcohol. She reports that she does not use drugs. She has a current medication list which includes the following prescription(s): acetaminophen, ibuprofen, OVER THE COUNTER MEDICATION, multivitamin-prenatal, and ursodiol. She has No Known Allergies.       Review of Systems:  Review of Systems  Constitutional: Denied constitutional symptoms, night sweats, recent illness, fatigue, fever, insomnia and weight loss.  Eyes: Denied eye symptoms, eye pain, photophobia, vision change and visual disturbance.  Ears/Nose/Throat/Neck: Denied ear, nose, throat or neck symptoms, hearing loss, nasal discharge, sinus congestion and sore throat.  Cardiovascular: Denied cardiovascular symptoms, arrhythmia, chest pain/pressure, edema, exercise intolerance, orthopnea and palpitations.  Respiratory: Denied pulmonary symptoms, asthma, pleuritic pain, productive sputum, cough, dyspnea and wheezing.  Gastrointestinal: Denied, gastro-esophageal reflux, melena, nausea and vomiting.  Genitourinary: Denied genitourinary symptoms including symptomatic vaginal discharge, pelvic relaxation issues, and urinary complaints.  Musculoskeletal: Denied musculoskeletal symptoms, stiffness, swelling, muscle weakness and myalgia.  Dermatologic: Denied dermatology symptoms, rash and scar.  Neurologic: Denied neurology symptoms, dizziness, headache, neck  pain and syncope.  Psychiatric: Denied psychiatric symptoms, anxiety and depression.   Endocrine: Denied endocrine symptoms including hot flashes and night sweats.   Meds:   Current Outpatient Medications on File Prior to Visit  Medication Sig Dispense Refill   acetaminophen (TYLENOL) 500 MG tablet Take 500 mg by mouth every 6 (six) hours as needed for moderate pain.     ibuprofen (ADVIL) 600 MG tablet Take 1 tablet (600 mg total) by mouth every 6 (six) hours. 30 tablet 0   OVER THE COUNTER MEDICATION Take 1 capsule by mouth as directed. "MILK SUPPLY PILLS"     Prenatal Vit-Fe Fumarate-FA (MULTIVITAMIN-PRENATAL) 27-0.8 MG TABS tablet Take 3 tablets by mouth daily at 12 noon.     ursodiol (ACTIGALL) 500 MG tablet Take 1 tablet (500 mg total) by mouth 2 (two) times daily. 60 tablet 3   No current facility-administered medications on file prior to visit.    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 Postpartum care following cesarean delivery     Patient now doing much better.  Her incision is doing much better.  Baby now home from the hospital and feeding better.  Continues to gain weight at this point.  Pain in her leg gradually improving.  Plan:            1.  Follow-up as scheduled in July for 6-week postop visit.  Expectation that most of her issues will continue to resolve. Orders No orders of the defined types were placed in this encounter.   No orders of the defined types were placed in this encounter.     F/U  No follow-ups on file.   Elonda Husky,  M.D. 03/27/2023 8:09 AM

## 2023-04-11 ENCOUNTER — Telehealth: Payer: Self-pay

## 2023-04-11 NOTE — Telephone Encounter (Signed)
Pt calling; having bad chest pain; started 2d ago.  [phone]  Pt isn't sure if it's breast feeding related or not; the right breast is tender; hurts to breathe; when time to pump she feels it again.  Adv pt to go to ER as they can evaluate her better there than we can here.

## 2023-04-17 ENCOUNTER — Ambulatory Visit (INDEPENDENT_AMBULATORY_CARE_PROVIDER_SITE_OTHER): Payer: Medicaid Other | Admitting: Obstetrics and Gynecology

## 2023-04-17 ENCOUNTER — Encounter: Payer: Self-pay | Admitting: Obstetrics and Gynecology

## 2023-04-17 VITALS — BP 95/65 | HR 69 | Ht 63.0 in | Wt 166.0 lb

## 2023-04-17 DIAGNOSIS — Z9889 Other specified postprocedural states: Secondary | ICD-10-CM

## 2023-04-17 NOTE — Progress Notes (Signed)
Patient presents today for 6 week postpartum follow-up. She had a cesarean delivery on 03/04/23.  Mom is pumping and bottle feeding. She states she would not like anything for birth control at this time. EPDS score of  8. She states questions regarding cholestasis after delivery.

## 2023-04-17 NOTE — Progress Notes (Signed)
HPI:      [name] is a 27 y.o. [phone] who LMP was Patient's last menstrual period was 05/26/2022 (exact date).  Subjective:   She presents today 6 weeks postpartum from cesarean delivery.  She reports occasional itching in her finger but not like the itching she had before.  She is no longer taking medication for cholestasis.  She is pumping and supplementing. She has some concerns regarding her abdominal wall.  She states it is not gone down appreciably since delivery.    Hx: The following portions of the patient's history were reviewed and updated as appropriate:             She  has a past medical history of Anemia and Asthma. She does not have any pertinent problems on file. She  has a past surgical history that includes Wisdom tooth extraction and Cesarean section (N/A, 03/04/2023). Her family history includes Asthma in her brother; Cataracts in her paternal grandfather; Diabetes in her maternal grandfather, mother, and paternal grandfather; Healthy in her father, paternal grandmother, and sister; Hyperlipidemia in her mother and paternal grandfather; Hypertension in her maternal grandfather, mother, and paternal grandfather. She  reports that she has never smoked. She has never used smokeless tobacco. She reports that she does not currently use alcohol. She reports that she does not use drugs. She has a current medication list which includes the following prescription(s): acetaminophen, ibuprofen, OVER THE COUNTER MEDICATION, multivitamin-prenatal, and ursodiol. She has No Known Allergies.       Review of Systems:  Review of Systems  Constitutional: Denied constitutional symptoms, night sweats, recent illness, fatigue, fever, insomnia and weight loss.  Eyes: Denied eye symptoms, eye pain, photophobia, vision change and visual disturbance.  Ears/Nose/Throat/Neck: Denied ear, nose, throat or neck symptoms, hearing loss, nasal discharge, sinus congestion and sore throat.   Cardiovascular: Denied cardiovascular symptoms, arrhythmia, chest pain/pressure, edema, exercise intolerance, orthopnea and palpitations.  Respiratory: Denied pulmonary symptoms, asthma, pleuritic pain, productive sputum, cough, dyspnea and wheezing.  Gastrointestinal: Denied, gastro-esophageal reflux, melena, nausea and vomiting.  Genitourinary: Denied genitourinary symptoms including symptomatic vaginal discharge, pelvic relaxation issues, and urinary complaints.  Musculoskeletal: Denied musculoskeletal symptoms, stiffness, swelling, muscle weakness and myalgia.  Dermatologic: Denied dermatology symptoms, rash and scar.  Neurologic: Denied neurology symptoms, dizziness, headache, neck pain and syncope.  Psychiatric: Denied psychiatric symptoms, anxiety and depression.  Endocrine: Denied endocrine symptoms including hot flashes and night sweats.   Meds:   Current Outpatient Medications on File Prior to Visit  Medication Sig Dispense Refill   acetaminophen (TYLENOL) 500 MG tablet Take 500 mg by mouth every 6 (six) hours as needed for moderate pain.     ibuprofen (ADVIL) 600 MG tablet Take 1 tablet (600 mg total) by mouth every 6 (six) hours. 30 tablet 0   OVER THE COUNTER MEDICATION Take 1 capsule by mouth as directed. "MILK SUPPLY PILLS"     Prenatal Vit-Fe Fumarate-FA (MULTIVITAMIN-PRENATAL) 27-0.8 MG TABS tablet Take 3 tablets by mouth daily at 12 noon.     ursodiol (ACTIGALL) 500 MG tablet Take 1 tablet (500 mg total) by mouth 2 (two) times daily. (Patient not taking: Reported on 04/17/2023) 60 tablet 3   No current facility-administered medications on file prior to visit.      Objective:     Vitals:   04/17/23 1403  BP: 95/65  Pulse: 69   Filed Weights   04/17/23 1403  Weight: 166 lb (75.3 kg)  Abdomen: Soft.  Non-tender.  No masses.  No HSM.  Incision/s: Intact.  Healing well.  No erythema.  No drainage.   Patient has what seems to be a large hernia  supraumbilical.  She also has some diastases recti.          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 Postoperative state   2. Postpartum care following cesarean delivery     Patient generally doing well postop and postpartum.  History of cholestasis with pregnancy  Abdominal laxity   Plan:            1.  Exercises discussed for abdominal laxity-2 consider surgical option in 3 months if this remains a problem  2.  Bile acids for confirmation of resolution of cholestasis  3.  Patient may resume normal activities with the exception of heavy lifting. Orders Orders Placed This Encounter  Procedures   Bile acids, total    No orders of the defined types were placed in this encounter.     F/U  Return in about 3 months (around 07/18/2023).  Tammy Contreras, M.D. 04/17/2023 2:34 PM

## 2023-04-19 ENCOUNTER — Other Ambulatory Visit: Payer: Self-pay

## 2023-04-19 DIAGNOSIS — R7989 Other specified abnormal findings of blood chemistry: Secondary | ICD-10-CM

## 2023-04-19 DIAGNOSIS — O26643 Intrahepatic cholestasis of pregnancy, third trimester: Secondary | ICD-10-CM

## 2023-04-19 LAB — BILE ACIDS, TOTAL: Bile Acids Total: 25.7 umol/L — ABNORMAL HIGH (ref 0.0–10.0)

## 2023-04-19 MED ORDER — URSODIOL 500 MG PO TABS
500.0000 mg | ORAL_TABLET | Freq: Two times a day (BID) | ORAL | 3 refills | Status: DC
Start: 2023-04-19 — End: 2023-05-14

## 2023-05-14 ENCOUNTER — Ambulatory Visit (INDEPENDENT_AMBULATORY_CARE_PROVIDER_SITE_OTHER): Payer: Medicaid Other | Admitting: Physician Assistant

## 2023-05-14 ENCOUNTER — Encounter: Payer: Self-pay | Admitting: Physician Assistant

## 2023-05-14 VITALS — BP 110/75 | HR 64 | Temp 97.4°F | Ht 63.0 in | Wt 166.6 lb

## 2023-05-14 DIAGNOSIS — R1011 Right upper quadrant pain: Secondary | ICD-10-CM | POA: Diagnosis not present

## 2023-05-14 DIAGNOSIS — R160 Hepatomegaly, not elsewhere classified: Secondary | ICD-10-CM

## 2023-05-14 DIAGNOSIS — O26643 Intrahepatic cholestasis of pregnancy, third trimester: Secondary | ICD-10-CM

## 2023-05-14 NOTE — Patient Instructions (Signed)
Ultrasound scheduled 05/17/23 @ 8:15 arrival @ [facility]. Nothing to eat/drink after midnight. Please go to [facility] for lab work today.

## 2023-05-14 NOTE — Progress Notes (Unsigned)
Tammy Amy, PA-C [address]  [address]  [place], [place] [address]  Main: [phone]  Fax: [phone]   Gastroenterology Consultation  Referring Provider:     Linzie Collin, MD Primary Care Physician:  Patient, No Pcp Per Primary Gastroenterologist:  Tammy Amy, PA-C / Dr. Wyline Contreras   Reason for Consultation:     follow-up of cholestasis during recent pregnancy        HPI:   Tammy Contreras is a 27 y.o. y/o female referred for consultation & management  by Patient, No Pcp Per.    Referred from her OB/GYN to follow-up of cholestasis during recent pregnancy.  She gave birth to her third child by C-section 03/04/2023.  During the 3rd trimester of her pregnancy she developed itching and cholestasis.  Bile acid of 15.  Treated with ursodiol.  Post partum she was told to follow-up with GI.  Currently she has no abdominal pain. She still has Itching in her hands and feet.  She is no longer taking ursodiol.  She is breast feeding.  Lab 02/16/23 showed Bile Acid 15.  Elevated Alk Phos, otherwise Normal LFTs. Lab 03/09/23 showed Normal LFTs. Lab 04/17/23 showed Bile Acid 25.    Abdominal pelvic CT 03/09/2023 showed mild diffuse fatty liver, 1.8 cm lesion in the left lateral liver lobe.  Suspected to be focal nodular hyperplasia.  Hepatic adenoma less likely.  Consider follow-up with MRI.  Normal gallbladder and bile ducts.  Normal pancreas, spleen.  Past Medical History:  Diagnosis Date   Anemia    Asthma     Past Surgical History:  Procedure Laterality Date   CESAREAN SECTION N/A 03/04/2023   Procedure: CESAREAN SECTION;  Surgeon: Tammy Collin, MD;  Location: [facility] ORS;  Service: Obstetrics;  Laterality: N/A;   WISDOM TOOTH EXTRACTION      Prior to Admission medications   Medication Sig Start Date End Date Taking? Authorizing Provider  acetaminophen (TYLENOL) 500 MG tablet Take 500 mg by mouth every 6 (six) hours as needed for moderate pain.   Yes [provider]  ibuprofen (ADVIL) 600 MG tablet Take 1 tablet (600 mg total) by mouth every 6 (six) hours. 03/06/23  Yes Tresea Mall, CNM  OVER THE COUNTER MEDICATION Take 1 capsule by mouth as directed. "MILK SUPPLY PILLS"   Yes [provider]  Prenatal Vit-Fe Fumarate-FA (MULTIVITAMIN-PRENATAL) 27-0.8 MG TABS tablet Take 3 tablets by mouth daily at 12 noon.   Yes [provider]    Family History  Problem Relation Age of Onset   Hypertension Mother        borderline   Diabetes Mother        Borderline   Hyperlipidemia Mother        borderline   Healthy Father    Healthy Sister    Asthma Brother    Diabetes Maternal Grandfather    Hypertension Maternal Grandfather    Healthy Paternal Grandmother    Hyperlipidemia Paternal Grandfather    Diabetes Paternal Grandfather    Hypertension Paternal Grandfather    Cataracts Paternal Grandfather    Breast cancer Neg Hx    Ovarian cancer Neg Hx    Colon cancer Neg Hx      Social History   Tobacco Use   Smoking status: Never   Smokeless tobacco: Never  Vaping Use   Vaping status: Never Used  Substance Use Topics   Alcohol use: Not Currently   Drug use: Never  Allergies as of 05/14/2023   (No Known Allergies)    Review of Systems:    All systems reviewed and negative except where noted in HPI.   Physical Exam:  BP 110/75   Pulse 64   Temp (!) 97.4 F (36.3 C)   Ht 5\' 3"  (1.6 m)   Wt 166 lb 9.6 oz (75.6 kg)   Breastfeeding Yes   BMI 29.51 kg/m  No LMP recorded.  General:   Alert,  Well-developed, well-nourished, pleasant and cooperative in NAD Lungs:  Respirations even and unlabored.  Clear throughout to auscultation.   No wheezes, crackles, or rhonchi. No acute distress. Heart:  Regular rate and rhythm; no murmurs, clicks, rubs, or gallops. Abdomen:  Normal bowel sounds.  No bruits.  Soft, and non-distended without masses, hepatosplenomegaly or hernias noted.  Mild RUQ Tenderness. Rest of  abdomen is not tender. No guarding or rebound tenderness.   C-section incision lower pelvis is well-healing. Neurologic:  Alert and oriented x3;  grossly normal neurologically. Psych:  Alert and cooperative. Normal Contreras and affect.  Imaging Studies: No results found.  Assessment and Plan:   Tammy Contreras is a 27 y.o. y/o female has been referred for follow-up of cholestasis during recent pregnancy.  She gave birth to her third child by C-section 03/04/2023.  During the end of her pregnancy she developed itching and cholestasis.  Bile acid of 15.  Treated with ursodiol.  Post partum she was told to follow-up with GI.  Currently she has no abdominal pain.  Itching in her hands and feet have improved.  She is no longer on ursodiol.  Cholestasis in pregnancy during third trimester Labs: Total bile acid, hepatic panel Labs: ANA, AMA, ASMA, ceruloplasmin, viral hepatitis A/B/C, iron panel, ferritin, immunoglobulins, alpha-1 antitrypsin   RUQ pain Scheduling Abdominal MRI / MRCP  Liver mass left lobe 1.8 cm lesion.  Likely focal nodular hyperplasia. Rule out adenoma.  Scheduling abdominal MRI / MRCP for follow-up.  Follow up 4 weeks with TG.  Tammy Amy, PA-C

## 2023-05-15 ENCOUNTER — Other Ambulatory Visit: Payer: Self-pay | Admitting: Physician Assistant

## 2023-05-15 ENCOUNTER — Other Ambulatory Visit: Payer: Self-pay

## 2023-05-15 ENCOUNTER — Encounter: Payer: Self-pay | Admitting: Physician Assistant

## 2023-05-15 ENCOUNTER — Telehealth: Payer: Self-pay

## 2023-05-15 DIAGNOSIS — R7989 Other specified abnormal findings of blood chemistry: Secondary | ICD-10-CM

## 2023-05-15 DIAGNOSIS — O26643 Intrahepatic cholestasis of pregnancy, third trimester: Secondary | ICD-10-CM

## 2023-05-15 DIAGNOSIS — K769 Liver disease, unspecified: Secondary | ICD-10-CM

## 2023-05-15 DIAGNOSIS — R109 Unspecified abdominal pain: Secondary | ICD-10-CM

## 2023-05-15 NOTE — Progress Notes (Signed)
Call and notify patient total bile acids are still elevated.  ALT liver test is also elevated.  Please do abdominal MRI/MRCP and more lab work to further evaluate liver.

## 2023-05-15 NOTE — Progress Notes (Signed)
Lab Orders

## 2023-05-15 NOTE — Telephone Encounter (Signed)
I spoke to pt and she wanted to know why her [place] was changed to an MRI... Per Inetta Fermo, CT in the past showed liver lesion and pt needs MRI to better evaluate and additional labs... Pt stated that this time and day for MRI will not work... # for scheduling given and pt stated that she will go to [facility] to get labs  Pt is also aware of the elevated LFT and Bile acids

## 2023-05-15 NOTE — Telephone Encounter (Signed)
Left message for patient to return call to office.   MRI Abdomen scheduled 8/19/@2 :30 pm @ [facility].Nothing to eat/drink 4 hours prior.  MRCP scheduled 05/21/23 @ [facility] @ 2:30 pm. Nothing to eat/drink 4 hours prior.  Ultrasound has been cancelled.   Please come by the office today for labs.

## 2023-05-17 ENCOUNTER — Ambulatory Visit: Payer: Medicaid Other

## 2023-05-21 ENCOUNTER — Ambulatory Visit: Payer: Medicaid Other

## 2023-05-23 ENCOUNTER — Telehealth: Payer: Self-pay | Admitting: Physician Assistant

## 2023-05-23 ENCOUNTER — Encounter: Payer: Self-pay | Admitting: Physician Assistant

## 2023-05-23 NOTE — Telephone Encounter (Signed)
Call patient and schedule hepatitis B vaccine.

## 2023-05-25 ENCOUNTER — Other Ambulatory Visit: Payer: Self-pay | Admitting: Physician Assistant

## 2023-05-25 ENCOUNTER — Encounter: Payer: Self-pay | Admitting: Physician Assistant

## 2023-05-25 ENCOUNTER — Ambulatory Visit: Payer: Medicaid Other

## 2023-05-25 ENCOUNTER — Ambulatory Visit
Admission: RE | Admit: 2023-05-25 | Discharge: 2023-05-25 | Disposition: A | Discharge status: Home / Self Care | Payer: Medicaid Other | Source: Ambulatory Visit | Attending: Physician Assistant | Admitting: Physician Assistant

## 2023-05-25 DIAGNOSIS — R109 Unspecified abdominal pain: Secondary | ICD-10-CM

## 2023-05-25 LAB — IRON,TIBC AND FERRITIN PANEL
Ferritin: 101 ng/mL (ref 15–150)
Iron: 74 ug/dL (ref 27–159)

## 2023-05-25 LAB — IMMUNOGLOBULINS A/E/G/M, SERUM: IgA/Immunoglobulin A, Serum: 261 mg/dL (ref 87–352)

## 2023-05-25 LAB — HEPATITIS C ANTIBODY: Hep C Virus Ab: NONREACTIVE

## 2023-05-25 LAB — CELIAC DISEASE AB SCREEN W/RFX: Antigliadin Abs, IgA: 5 units (ref 0–19)

## 2023-05-25 LAB — HEPATITIS B CORE ANTIBODY, TOTAL: Hep B Core Total Ab: NEGATIVE

## 2023-05-25 LAB — HEPATITIS A ANTIBODY, IGM: Hep A IgM: NEGATIVE

## 2023-05-25 MED ORDER — GADOBUTROL 1 MMOL/ML IV SOLN
7.0000 mL | Freq: Once | INTRAVENOUS | Status: AC | PRN
  Administered 2023-05-25: 7 mL via INTRAVENOUS

## 2023-05-28 NOTE — Progress Notes (Signed)
Call and notify pt.: Abdominal MRI shows: 1. Moderate diffuse hepatic steatosis (Fatty Liver).  **Rec. Low fat diet, exercise. 2.  2 small 7mm and 4mm liver lesions appear benign however are inconclusive. **Rec. repeat MRI in 6 months to ensure stability / no change.   3.  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  This is NOT worrisome. Nothing to do about that. 4.  Keep f/u OV 10/4. Celso Amy, PA-C

## 2023-06-05 ENCOUNTER — Ambulatory Visit: Payer: Medicaid Other | Admitting: Obstetrics and Gynecology

## 2023-06-07 ENCOUNTER — Ambulatory Visit: Payer: Medicaid Other | Admitting: Obstetrics and Gynecology

## 2023-07-02 ENCOUNTER — Other Ambulatory Visit: Payer: Self-pay

## 2023-07-04 ENCOUNTER — Encounter: Payer: Self-pay | Admitting: Obstetrics and Gynecology

## 2023-07-04 ENCOUNTER — Ambulatory Visit: Payer: Medicaid Other | Admitting: Obstetrics and Gynecology

## 2023-07-04 VITALS — BP 96/69 | HR 69 | Resp 16 | Ht 63.0 in | Wt 164.5 lb

## 2023-07-04 DIAGNOSIS — M6208 Separation of muscle (nontraumatic), other site: Secondary | ICD-10-CM

## 2023-07-04 DIAGNOSIS — K429 Umbilical hernia without obstruction or gangrene: Secondary | ICD-10-CM | POA: Diagnosis not present

## 2023-07-04 NOTE — Progress Notes (Signed)
    GYNECOLOGY PROGRESS NOTE  Subjective:    Patient ID: Tammy Contreras, female    DOB: 04/16/1996, 27 y.o.   MRN: [id number]  HPI  Patient is a 27 y.o. G12P3003 female who presents for evaluation of hernia. Hernia was noted during her 6 week postpartum check with Dr. Brennan Bailey. He noted that she has what seems to be a large hernia supraumbilical. She also was told she had diastasis recti. Is now currently 3 months postpartum.l She reports today that hernia has gotten smaller since her last visit, however is still painful at times, especially when her pants are at the level of the hernia.    The following portions of the patient's history were reviewed and updated as appropriate: allergies, current medications, past family history, past medical history, past social history, past surgical history, and problem list.  Review of Systems Pertinent items are noted in HPI.   Objective:   Blood pressure 96/69, pulse 69, resp. rate 16, height 5\' 3"  (1.6 m), weight 164 lb 8 oz (74.6 kg), currently breastfeeding.  Body mass index is 29.14 kg/m. General appearance: alert, cooperative, and no distress Abdomen: soft, non-tender; bowel sounds normal; no masses,  no organomegaly. Moderate diastasis noted on Valsalva and with coughing.  No obvious hernia palpable one exam. Moderate amount of loose skin at abdomen.  Neurologic: Grossly normal   Assessment:   1. Diastasis recti   2. Umbilical hernia without obstruction and without gangrene      Plan:   Discussed diagnoses with patient in detail. Advised on 2 options for management, conservative or surgical.  Would recommend conservative management (referral to physical therapy) if she has not yet completed childbearing. If surgical management is desired, can send referral to Plastic Surgeon.  Patient ok to refer to physical therapy for now, but notes she will in the near future desire surgery once her infant is a little older.   Return to  clinic for any scheduled appointments or for any gynecologic concerns as needed.    Hildred Laser, MD [facility]

## 2023-07-05 NOTE — Progress Notes (Deleted)
Celso Amy, PA-C [address]  [address]  [place], [place] [address]  Main: [phone]  Fax: [phone]   Primary Care Physician: Patient, No Pcp Per  Primary Gastroenterologist:  Celso Amy, PA-C / Dr. Wyline Mood    CC:  F/U cholestasis during pregnancy  HPI: Tammy Contreras is a 27 y.o. female 6-week follow-up for cholestasis during recent pregnancy.  She gave birth to her third child by C-section 03/04/2023.  During the 3rd trimester of her pregnancy she developed itching and cholestasis.  Bile acid of 15.  Treated with ursodiol.  Abdominal pain resolved.  She is breast feeding.   Lab 02/16/23 showed Bile Acid 15.  Elevated Alk Phos, otherwise Normal LFTs. Lab 03/09/23 showed Normal LFTs. Lab 04/17/23 showed Bile Acid 25.7.   Lab 05/14/2023: Bile acid 24.7, elevated ALT 69.  All other LFTs normal. Lab 05/21/2023: Negative acute viral hepatitis A/B/C.  Immune to hepatitis A.  Not immune to hepatitis B.  Celiac labs negative.  Normal iron, ceruloplasmin.  Negative ANA, AMA, ASMA, A-1-A.  Negative HIV.   Abdominal pelvic CT 03/09/2023 showed mild diffuse fatty liver, 1.8 cm lesion in the left lateral liver lobe.  Suspected to be focal nodular hyperplasia.  Hepatic adenoma less likely.  Normal gallbladder and bile ducts.  Normal pancreas, spleen.  Abdominal MRI / MRCP 05/25/2023: Marked diffuse hepatic steatosis.  15 mm lesion in the medial left lobe of liver demonstrates connection with middle hepatic vein and middle portal vein consistent with intrahepatic shunt.  Taking ursodiol?  Current Outpatient Medications  Medication Sig Dispense Refill   acetaminophen (TYLENOL) 500 MG tablet Take 500 mg by mouth every 6 (six) hours as needed for moderate pain.     ibuprofen (ADVIL) 600 MG tablet Take 1 tablet (600 mg total) by mouth every 6 (six) hours. 30 tablet 0   OVER THE COUNTER MEDICATION Take 1 capsule by mouth as directed. "MILK SUPPLY PILLS"     Prenatal Vit-Fe  Fumarate-FA (MULTIVITAMIN-PRENATAL) 27-0.8 MG TABS tablet Take 3 tablets by mouth daily at 12 noon.     ursodiol (ACTIGALL) 500 MG tablet Take 500 mg by mouth 2 (two) times daily.     No current facility-administered medications for this visit.    Allergies as of 07/06/2023   (No Known Allergies)    Past Medical History:  Diagnosis Date   Anemia    Asthma     Past Surgical History:  Procedure Laterality Date   CESAREAN SECTION N/A 03/04/2023   Procedure: CESAREAN SECTION;  Surgeon: Linzie Collin, MD;  Location: [facility] ORS;  Service: Obstetrics;  Laterality: N/A;   WISDOM TOOTH EXTRACTION      Review of Systems:    All systems reviewed and negative except where noted in HPI.   Physical Examination:   There were no vitals taken for this visit.  General: Well-nourished, well-developed in no acute distress.  Lungs: Clear to auscultation bilaterally. Non-labored. Heart: Regular rate and rhythm, no murmurs rubs or gallops.  Abdomen: Bowel sounds are normal; Abdomen is Soft; No hepatosplenomegaly, masses or hernias;  No Abdominal Tenderness; No guarding or rebound tenderness. Neuro: Alert and oriented x 3.  Grossly intact.  Psych: Alert and cooperative, normal mood and affect.   Imaging Studies: No results found.  Assessment and Plan:   Tammy Contreras is a 27 y.o. y/o female follow-up of cholestasis during pregnancy.  Extensive testing showed marked hepatic steatosis and intrahepatic shunt.  No evidence of  acute viral hepatitis, celiac, HIV, PBC, autoimmune hepatitis, or liver masses.  1.  Cholestasis during pregnancy  Lab: Total bile acids, hepatic panel  Continue ursodiol 500 mg twice daily  2.  Marked hepatic steatosis  Recommend a low-fat diet, regular exercise, and weight loss. Patient education handout about fatty liver disease was given and discussed from up-to-date.   3.  Give vaccine for hepatitis B  Celso Amy, PA-C  Follow up ***  BP check ***

## 2023-07-06 ENCOUNTER — Ambulatory Visit: Payer: Medicaid Other | Admitting: Physician Assistant

## 2023-07-25 ENCOUNTER — Other Ambulatory Visit: Payer: Self-pay

## 2023-07-25 ENCOUNTER — Ambulatory Visit: Payer: Medicaid Other | Attending: Obstetrics and Gynecology

## 2023-07-25 DIAGNOSIS — M6281 Muscle weakness (generalized): Secondary | ICD-10-CM | POA: Insufficient documentation

## 2023-07-25 DIAGNOSIS — M6208 Separation of muscle (nontraumatic), other site: Secondary | ICD-10-CM | POA: Diagnosis present

## 2023-07-25 DIAGNOSIS — R2689 Other abnormalities of gait and mobility: Secondary | ICD-10-CM | POA: Diagnosis present

## 2023-07-25 DIAGNOSIS — R293 Abnormal posture: Secondary | ICD-10-CM | POA: Insufficient documentation

## 2023-07-25 DIAGNOSIS — K429 Umbilical hernia without obstruction or gangrene: Secondary | ICD-10-CM | POA: Insufficient documentation

## 2023-07-25 NOTE — Therapy (Addendum)
OUTPATIENT PHYSICAL THERAPY FEMALE PELVIC EVALUATION   Patient Name: Tammy Contreras MRN: [id number] DOB:01-31-1996, 27 y.o., female Today's Date: 07/25/2023  END OF SESSION:  PT End of Session - 07/25/23 1326     Visit Number 1    Number of Visits 9    Date for PT Re-Evaluation 09/23/23    Authorization Type Medicaid    Progress Note Due on Visit 10    PT Start Time 1321   pt late   PT Stop Time 1405    PT Time Calculation (min) 44 min    Activity Tolerance Patient tolerated treatment well    Behavior During Therapy WFL for tasks assessed/performed             Past Medical History:  Diagnosis Date   Anemia    Asthma    Past Surgical History:  Procedure Laterality Date   CESAREAN SECTION N/A 03/04/2023   Procedure: CESAREAN SECTION;  Surgeon: Linzie Collin, MD;  Location: [facility] ORS;  Service: Obstetrics;  Laterality: N/A;   WISDOM TOOTH EXTRACTION     Patient Active Problem List   Diagnosis Date Noted   Postpartum care following cesarean delivery 03/06/2023   Encounter for care or examination of lactating mother 03/06/2023   Post-operative state 03/04/2023   Cholestasis during pregnancy in third trimester 02/22/2023   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/2022   Vitamin D deficiency 08/15/2022   Anemia in pregnancy 08/15/2022   Supervision of other normal pregnancy, antepartum 07/31/2022   History of anemia 02/22/2019   History of asthma 02/22/2019    PCP: Dr. Hildred Laser  REFERRING PROVIDER: Dr. Hildred Laser  REFERRING DIAG: Diastasis recti and umbilical hernia  THERAPY DIAG:  Other abnormalities of gait and mobility - Plan: PT plan of care cert/re-cert  Muscle weakness (generalized) - Plan: PT plan of care cert/re-cert  Abnormal posture - Plan: PT plan of care cert/re-cert  Diastasis recti - Plan: PT plan of care cert/re-cert  Rationale for Evaluation and Treatment: Rehabilitation  ONSET DATE: 07/04/23 referral date   SUBJECTIVE:                                                                                                                                                                                            SUBJECTIVE STATEMENT: Pt reported she noticed issues starting around 6 weeks postpartum (03/04/23 c-section-third baby), regarding hernia. She had diastasis recti with first two pregnancies but the hernia is new (after third child). She went back to doctor at four months postpartum and it hasn't improved much since that time. She went back to work and wears high waist  pants/scrubs and the waistband sits at the hernia and she feels more pressure. Pt is a Sales executive, a lot of standing and sitting.  URINARY FUNCTION: pt voids 6-8 times a day. Pt has leakage with laughing, coughing, and sneezing. Approx. Twice a week. Pt does not wear a pad. Pt denied pain with voiding. Pt gets up to pump and then urinates.  BOWEL FUNCTION: pt reported pain around hernia when straining to have BM. Pt has a BM approx. Twice a day. She had bad constipation postpartum until about a month ago. Pt has "normal" BM approx. 80% of the time. The other 20% is constipation.  SEXUAL FUNCTION: Pt had pain the first few times postpartum but no longer has pain with her on top or lying supine. She now has pain with certain positions such as: when pt is on her knees with initial penetration.  CORE STABILITY: pt has diastasis recti and umbilical hernia per MD after this last pregnancy. No prior MVAs. Pt did have cholestasis during pregnancy, awaiting blood work to check gall bladder. Pt's first two deliveries were vaginal and last one was c-section (scheduled 2/2 baby in transverse). Pt has LBP when standing after approx. 8 minutes, notices when she's washing dishes.   Fluid intake: Yes: pt wakes up and has coffee (caffeine), then have about 12 oz. Of water from 8-12pm, 2-5pm about 20 oz of water, at home approx. 10 oz. Of water and 10 oz. Of either juice (orange)  of soda. Pt has had alcohol once in the past two years.     PAIN:  Are you having pain? No NPRS scale: 0/10 Pain location:  but does have LBP with standing: at worst: 5/10, at best: 0/10, on average: 2/10.  Pain type: dull Pain description: intermittent   Aggravating factors: standing 8+minutes Relieving factors: sitting down  PRECAUTIONS: None  RED FLAGS: None   WEIGHT BEARING RESTRICTIONS: No  FALLS:  Has patient fallen in last 6 months? No  LIVING ENVIRONMENT: Lives with: lives with their family, lives with their spouse, lives with their son, and lives with their daughter Lives in: House/apartment Stairs: Yes: External: 3-4 steps; none Has following equipment at home: None  OCCUPATION: Sales executive full-time  PLOF: Independent  PATIENT GOALS: Top goal: reduce hernia symptoms, 2nd: strengthen pelvic floor, 3rd: repair diastasis recti   PERTINENT HISTORY:  Hx of anemia, hx of asthma in childhood, vitamin D deficiency (in pregnancy per pt), c-section 03/2023, cholestasis in pregnancy and postpartum and awaiting new blood work to re-assess.  Sexual abuse: No  BOWEL MOVEMENT: Pain with bowel movement: Yes Type of bowel movement:Frequency twice a day with pain/constipation approx. 20% of the time Fully empty rectum: Yes: most of the time Leakage: No Pads: No Fiber supplement: No  URINATION: Pain with urination: No Fully empty bladder: Yes:   Stream: Strong Urgency: No Frequency: 6-8x/day Leakage: Coughing, Sneezing, and Laughing Pads: No  INTERCOURSE: Pain with intercourse: Initial Penetration Ability to have vaginal penetration:  Yes: with pain in certain positions (pt on knees) Climax: yes Marinoff Scale: 1/3  PREGNANCY: Vaginal deliveries 2 Tearing No C-section deliveries 1 Currently pregnant No  PROLAPSE: None   OBJECTIVE:  Note: Objective measures were completed at Evaluation unless otherwise noted.   COGNITION: Overall cognitive  status: Within functional limits for tasks assessed     SENSATION: Light touch: Appears intact Proprioception: Appears intact    GAIT: Distance walked: 39' Assistive device utilized: None Level of assistance: Complete Independence Comments: wide  BOS, post. Pelvic tilt, hips forward, trunk back (on her Y ligaments)  POSTURE: posterior pelvic tilt and see above.  PELVIC ALIGNMENT: post. Pelvic tilt  LUMBARAROM/PROM  A/PROM A/PROM  eval  Flexion WNL  Extension Limited ~25%  Right lateral flexion WNL  Left lateral flexion WNL  Right rotation Limited ~25%  Left rotation Limited ~25%   (Blank rows = not tested)  LOWER EXTREMITY ROM:  Active ROM Right eval Left eval  Hip flexion    Hip extension    Hip abduction    Hip adduction    Hip internal rotation    Hip external rotation    Knee flexion    Knee extension    Ankle dorsiflexion    Ankle plantarflexion    Ankle inversion    Ankle eversion     (Blank rows = not tested)  LOWER EXTREMITY MMT:  MMT Right eval Left eval  Hip flexion    Hip extension    Hip abduction    Hip adduction    Hip internal rotation    Hip external rotation    Knee flexion    Knee extension    Ankle dorsiflexion    Ankle plantarflexion    Ankle inversion    Ankle eversion     PALPATION: Will perform next session. PELVIC MMT:   MMT eval  Vaginal   Internal Anal Sphincter   External Anal Sphincter   Puborectalis   Diastasis Recti   (Blank rows = not tested)        TONE: limited by time constraints   PROLAPSE: limited by time constraints   TODAY'S TREATMENT:                                                                                                                              DATE: 07/25/23 EVAL   SELF CARE: PATIENT EDUCATION:  Education details: PT educated pt on PFM functions, IAP and how pressure can cause hernia and pain. Pt asked about if she should schedule surgery or try PT, PT educated pt on how  we can improve diastasis recti and improve strength but that it is up to her MD to make surgical decisions.  PT educated pt on the following: TOILET POSTURE: Urination: feet flat, lean forward with forearms on legs to fully empty bladder. Bowel movement: place feet flat on Squatty Potty or stool so knees are higher than hips, lean forward to relax pelvic floor in order to avoid strain.  SHOES: wear supportive shoes, and sandals with straps.  POSTURE: try not to cross legs at knees or ankles. Try the figure four stretch instead.  WATER: start with water first thing in the morning.   PELVIC TILTS: try to stand in neutral, not tucking your "tail" and not arching back, but in the middle.  Person educated: Patient Education method: Explanation, Demonstration, Verbal cues, and Handouts Education comprehension: verbalized understanding, returned demonstration, and needs further education  HOME  EXERCISE PROGRAM: See above.  ASSESSMENT:  CLINICAL IMPRESSION: Patient is a pleasant 27 y.o. female who was seen today for physical therapy evaluation and treatment for diastasis recti and umbilical hernia and SUI. Pt's PMH is significant for the following: Hx of anemia, hx of asthma in childhood, vitamin D deficiency (in pregnancy per pt), c-section 03/2023, cholestasis in pregnancy and postpartum and awaiting new blood work to re-assess. Full exam not performed as limited by time constraints but PT will complete next session and PT provided self care ed for pt to implement in order to improve s/s. The following impairments were noted upon exam: gait deviations, impaired posture (sway back posture), decr. Strength likely based on subjective reports, SUI, posture and gait deviations, impaired coordination also likely based on s/s, decr. ROM (trunk rotation and back ext) and LBP. Pt would benefit from skilled PT to improve deficits listed above and safety during work, caring for family and all  ADLs.  OBJECTIVE IMPAIRMENTS: Abnormal gait, decreased balance, decreased coordination, decreased ROM, decreased strength, hypomobility, increased fascial restrictions, impaired flexibility, improper body mechanics, postural dysfunction, and pain.   ACTIVITY LIMITATIONS: carrying, lifting, bending, sitting, standing, squatting, transfers, bed mobility, continence, dressing, locomotion level, and caring for others  PARTICIPATION LIMITATIONS: meal prep, cleaning, laundry, interpersonal relationship, shopping, community activity, and occupation  PERSONAL FACTORS: Past/current experiences and 3+ comorbidities: see above.  are also affecting patient's functional outcome.   REHAB POTENTIAL: Good  CLINICAL DECISION MAKING: Stable/uncomplicated  EVALUATION COMPLEXITY: Low   GOALS: Goals reviewed with patient? Yes  SHORT TERM GOALS: Target date: 08/22/23  Pt will be IND in HEP to improve pain, strength, coordination. Baseline: no HEP Goal status: INITIAL  2.  Pt will complete FOTO and PT will write goal as indicated. Baseline: limited by time constraints Goal status: INITIAL  3.  Finish exam and write goals as indicated. Baseline:  limited by time constraints Goal status: INITIAL  4.  Pt will demo proper toileting posture to fully empty bladder and reduce straining during bowel movement. Baseline: not able to perform Goal status: INITIAL  5.  Pt will demonstrate proper abdominal massage and incr. In fiber rich foods to decr. Constipation.  Baseline: not performing Goal status: INITIAL    LONG TERM GOALS: Target date: 09/19/23  Pt will demonstrated improved relaxation and contraction of PFM with coordination of breath to reduce urinary leakage to </=once/week. Baseline: twice a week Goal status: INITIAL  2.  Pt will demonstrate improved relaxation and contraction of pelvic floor muscles (PFM) with coordination of breath to decr. Pain with intercourse with spouse. Baseline:  pain with initial penetration Goal status: INITIAL  3.  Pt will demonstrate proper scar mobilization of c-section scar to decr. Pain and PFM tension. Baseline: not performing Goal status: INITIAL  4.  Assess for diastasis recti and write goal as indicated. Baseline: limited by time constraints Goal status: INITIAL  5.  Pt will improve posture and hip/LE strength to be able to stand for 15+ minutes without incr. In LBP. Baseline: stand for 8+ minutes while washing dishes with 5/10 pain. Goal status: INITIAL   PLAN: complete exam (scar assessment, DB, DR, MMT, palpation), review ed, begin HEP  PT FREQUENCY: 1x/week  PT DURATION: 8 weeks  PLANNED INTERVENTIONS: 97164- PT Re-evaluation, 97110-Therapeutic exercises, 97530- Therapeutic activity, 97112- Neuromuscular re-education, 97535- Self Care, 40981- Manual therapy, [phone]- Gait training, Patient/Family education, Balance training, Dry Needling, Joint manipulation, Spinal manipulation, Spinal mobilization, Scar mobilization, Moist heat, and Biofeedback  Ossiel Marchio L, PT 07/25/2023, 2:33 PM  Zerita Boers, PT,DPT 07/25/23 2:33 PM Phone: [phone] Fax: [phone]

## 2023-07-25 NOTE — Patient Instructions (Signed)

## 2023-07-31 ENCOUNTER — Ambulatory Visit: Payer: Medicaid Other | Admitting: Obstetrics and Gynecology

## 2023-08-01 ENCOUNTER — Ambulatory Visit: Payer: Medicaid Other

## 2023-08-01 ENCOUNTER — Other Ambulatory Visit: Payer: Self-pay

## 2023-08-01 DIAGNOSIS — R2689 Other abnormalities of gait and mobility: Secondary | ICD-10-CM

## 2023-08-01 DIAGNOSIS — M6208 Separation of muscle (nontraumatic), other site: Secondary | ICD-10-CM

## 2023-08-01 DIAGNOSIS — R293 Abnormal posture: Secondary | ICD-10-CM

## 2023-08-01 DIAGNOSIS — M6281 Muscle weakness (generalized): Secondary | ICD-10-CM

## 2023-08-01 NOTE — Therapy (Signed)
OUTPATIENT PHYSICAL THERAPY FEMALE PELVIC TREATMENT    Patient Name: Tammy Contreras MRN: [id number] DOB:Jan 05, 1996, 27 y.o., female Today's Date: 08/01/2023  END OF SESSION:  PT End of Session - 08/01/23 1317     Visit Number 2    Number of Visits 9    Date for PT Re-Evaluation 09/23/23    Authorization Type Medicaid    Progress Note Due on Visit 10    PT Start Time 1315    PT Stop Time 1400    PT Time Calculation (min) 45 min    Activity Tolerance Patient tolerated treatment well    Behavior During Therapy WFL for tasks assessed/performed             Past Medical History:  Diagnosis Date   Anemia    Asthma    Past Surgical History:  Procedure Laterality Date   CESAREAN SECTION N/A 03/04/2023   Procedure: CESAREAN SECTION;  Surgeon: Linzie Collin, MD;  Location: [facility] ORS;  Service: Obstetrics;  Laterality: N/A;   WISDOM TOOTH EXTRACTION     Patient Active Problem List   Diagnosis Date Noted   Postpartum care following cesarean delivery 03/06/2023   Encounter for care or examination of lactating mother 03/06/2023   Post-operative state 03/04/2023   Cholestasis during pregnancy in third trimester 02/22/2023   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/2022   Vitamin D deficiency 08/15/2022   Anemia in pregnancy 08/15/2022   Supervision of other normal pregnancy, antepartum 07/31/2022   History of anemia 02/22/2019   History of asthma 02/22/2019    PCP: Dr. Hildred Laser  REFERRING PROVIDER: Dr. Hildred Laser  REFERRING DIAG: Diastasis recti and umbilical hernia  THERAPY DIAG:  Other abnormalities of gait and mobility  Muscle weakness (generalized)  Abnormal posture  Diastasis recti  Rationale for Evaluation and Treatment: Rehabilitation  ONSET DATE: 07/04/23 referral date   SUBJECTIVE:                                                                                                                                                                                            SUBJECTIVE STATEMENT: Pt reported she has been feeling good overall. She's been trying to drink water before her coffee in the morning. Pt is slowly transitioning to proper toileting posture. Pt denied changes in hernia since last visit.    PAIN:  Are you having pain? No NPRS scale: 0/10 Pain location:  but does have LBP with standing: at worst: 5/10, at best: 0/10, on average: 2/10.  Pain type: dull Pain description: intermittent   Aggravating factors: standing 8+minutes Relieving factors: sitting down  PRECAUTIONS: None  RED FLAGS: None   WEIGHT BEARING RESTRICTIONS: No  FALLS:  Has patient fallen in last 6 months? No  LIVING ENVIRONMENT: Lives with: lives with their family, lives with their spouse, lives with their son, and lives with their daughter Lives in: House/apartment Stairs: Yes: External: 3-4 steps; none Has following equipment at home: None  OCCUPATION: Sales executive full-time  PLOF: Independent  PATIENT GOALS: Top goal: reduce hernia symptoms, 2nd: strengthen pelvic floor, 3rd: repair diastasis recti   PERTINENT HISTORY:  Hx of anemia, hx of asthma in childhood, vitamin D deficiency (in pregnancy per pt), c-section 03/2023, cholestasis in pregnancy and postpartum and awaiting new blood work to re-assess.  Sexual abuse: No  BOWEL MOVEMENT: Pain with bowel movement: Yes Type of bowel movement:Frequency twice a day with pain/constipation approx. 20% of the time Fully empty rectum: Yes: most of the time Leakage: No Pads: No Fiber supplement: No  URINATION: Pain with urination: No Fully empty bladder: Yes:   Stream: Strong Urgency: No Frequency: 6-8x/day Leakage: Coughing, Sneezing, and Laughing Pads: No  INTERCOURSE: Pain with intercourse: Initial Penetration Ability to have vaginal penetration:  Yes: with pain in certain positions (pt on knees) Climax: yes Marinoff Scale: 1/3  PREGNANCY: Vaginal deliveries  2 Tearing No C-section deliveries 1 Currently pregnant No  PROLAPSE: None   OBJECTIVE:  Note: Objective measures were completed at Evaluation unless otherwise noted.   COGNITION: Overall cognitive status: Within functional limits for tasks assessed     SENSATION: Light touch: Appears intact Proprioception: Appears intact    GAIT: Distance walked: 61' Assistive device utilized: None Level of assistance: Complete Independence Comments: wide BOS, post. Pelvic tilt, hips forward, trunk back (on her Y ligaments)  POSTURE: posterior pelvic tilt and see above.  PELVIC ALIGNMENT: post. Pelvic tilt  LUMBARAROM/PROM  A/PROM A/PROM  eval  Flexion WNL  Extension Limited ~25%  Right lateral flexion WNL  Left lateral flexion WNL  Right rotation Limited ~25%  Left rotation Limited ~25%   (Blank rows = not tested)  LOWER EXTREMITY MMT:   MMT Right eval Left eval  Hip flexion 4+/5 4+/5  Hip extension    Hip abduction    Hip adduction    Hip internal rotation 4/5 3+/5  Hip external rotation 4/5 4/5  Knee flexion 4/5 4/5  Knee extension 5/5 5/5  Ankle dorsiflexion 5/5 5/5  Ankle plantarflexion    Ankle inversion    Ankle eversion     (Blank rows = not tested)  LOWER EXTREMITY ROM: all WNL with slight pain during L hip ER.   ROM Right eval Left eval  Hip flexion    Hip extension    Hip abduction    Hip adduction    Hip internal rotation    Hip external rotation    Knee flexion    Knee extension    Ankle dorsiflexion    Ankle plantarflexion    Ankle inversion    Ankle eversion     PALPATION: Hypomobility at Tx spine with TTP. TTP over L SIJ and L glutes and hip rotators.  Diastasis recti: 4 fingers width sup and inf. To umbilicus with PT being mindful of hernia sup. To umbilicus.  PELVIC MMT:   MMT eval  Vaginal   Internal Anal Sphincter   External Anal Sphincter   Puborectalis   Diastasis Recti   (Blank rows = not tested)         TONE: WNL   PROLAPSE: Not assessed.   TODAY'S  TREATMENT:                                                                                                                              DATE: 08/01/23  NMR: Finished exam: MMT, ROM, and palpation--Hypomobility at Tx spine with TTP. TTP over L SIJ and L glutes and hip rotators.  Diastasis recti: 4 fingers width sup and inf. To umbilicus with PT being mindful of hernia sup. To umbilicus.   Access Code: [email or web address] URL: [email or web address] Date: 08/01/2023 Prepared by: Zerita Boers  Exercises - Supine Angels  - 1 x daily - 7 x weekly - 1 sets - 10 reps - Supine Transversus Abdominis Bracing - Hands on Ground  - 1-2 x daily - 7 x weekly - 1 sets - 10 reps - Supine Pelvic Tilt  - 1 x daily - 7 x weekly - 1 sets - 10 reps - Sidelying Diaphragmatic Breathing  - 1 x daily - 7 x weekly - 1 sets - 5 reps Cues and demo for proper technique. S for safety. No incr. In pain.  SELF CARE: PATIENT EDUCATION:  Education details: PT educated pt on exam findings, IAP and how diastasis recti impacts IAP and PFM function. PT provided pt with HEP. Person educated: Patient Education method: Explanation, Demonstration, Verbal cues, and Handouts Education comprehension: verbalized understanding, returned demonstration, and needs further education  HOME EXERCISE PROGRAM: See above.  ASSESSMENT:  CLINICAL IMPRESSION: Today's skilled session focused on the completing exam and establishing HEP to improve mobility, activate deep core and breath work coordination. The following was found: Hypomobility at Tx spine with TTP. TTP over L SIJ and L glutes and hip rotators.  Diastasis recti: 4 fingers width sup and inf. To umbilicus with PT being mindful of hernia sup. To umbilicus and impaired core, hip and LE strength. The following impairments continue to be noted upon exam: gait deviations, impaired posture (sway back posture), decr. Strength  likely based on subjective reports, SUI, posture and gait deviations, impaired coordination also likely based on s/s, decr. ROM (trunk rotation and back ext) and LBP. Pt would continue to benefit from skilled PT to improve deficits listed above and safety during work, caring for family and all ADLs.  OBJECTIVE IMPAIRMENTS: Abnormal gait, decreased balance, decreased coordination, decreased ROM, decreased strength, hypomobility, increased fascial restrictions, impaired flexibility, improper body mechanics, postural dysfunction, and pain.   ACTIVITY LIMITATIONS: carrying, lifting, bending, sitting, standing, squatting, transfers, bed mobility, continence, dressing, locomotion level, and caring for others  PARTICIPATION LIMITATIONS: meal prep, cleaning, laundry, interpersonal relationship, shopping, community activity, and occupation  PERSONAL FACTORS: Past/current experiences and 3+ comorbidities: see above.  are also affecting patient's functional outcome.   REHAB POTENTIAL: Good  CLINICAL DECISION MAKING: Stable/uncomplicated  EVALUATION COMPLEXITY: Low   GOALS: Goals reviewed with patient? Yes  SHORT TERM GOALS: Target date: 08/22/23  Pt will be IND in HEP to improve pain, strength, coordination. Baseline: no HEP Goal status:  INITIAL  2.  Pt will complete FOTO and PT will write goal as indicated. Baseline: limited by time constraints Goal status: INITIAL  3.  Finish exam and write goals as indicated. Baseline:  limited by time constraints Goal status: INITIAL  4.  Pt will demo proper toileting posture to fully empty bladder and reduce straining during bowel movement. Baseline: not able to perform Goal status: INITIAL  5.  Pt will demonstrate proper abdominal massage and incr. In fiber rich foods to decr. Constipation.  Baseline: not performing Goal status: INITIAL    LONG TERM GOALS: Target date: 09/19/23  Pt will demonstrated improved relaxation and contraction of PFM  with coordination of breath to reduce urinary leakage to </=once/week. Baseline: twice a week Goal status: INITIAL  2.  Pt will demonstrate improved relaxation and contraction of pelvic floor muscles (PFM) with coordination of breath to decr. Pain with intercourse with spouse. Baseline: pain with initial penetration Goal status: INITIAL  3.  Pt will demonstrate proper scar mobilization of c-section scar to decr. Pain and PFM tension. Baseline: not performing Goal status: INITIAL  4.  Assess for diastasis recti and write goal as indicated. Baseline: limited by time constraints Goal status: INITIAL  5.  Pt will improve posture and hip/LE strength to be able to stand for 15+ minutes without incr. In LBP. Baseline: stand for 8+ minutes while washing dishes with 5/10 pain. Goal status: INITIAL   PLAN: review HEP and progress as tolerated, manual therapy prn (internal assessment?)  PT FREQUENCY: 1x/week  PT DURATION: 8 weeks  PLANNED INTERVENTIONS: 97164- PT Re-evaluation, 97110-Therapeutic exercises, 97530- Therapeutic activity, 97112- Neuromuscular re-education, 97535- Self Care, 16109- Manual therapy, [phone]- Gait training, Patient/Family education, Balance training, Dry Needling, Joint manipulation, Spinal manipulation, Spinal mobilization, Scar mobilization, Moist heat, and Biofeedback    Vasiliki Smaldone L, PT 08/01/2023, 1:18 PM  Zerita Boers, PT,DPT 08/01/23 1:18 PM Phone: [phone] Fax: [phone]

## 2023-08-08 ENCOUNTER — Ambulatory Visit: Payer: Medicaid Other | Attending: Obstetrics and Gynecology

## 2023-08-08 ENCOUNTER — Other Ambulatory Visit: Payer: Self-pay

## 2023-08-08 DIAGNOSIS — R293 Abnormal posture: Secondary | ICD-10-CM | POA: Diagnosis present

## 2023-08-08 DIAGNOSIS — R2689 Other abnormalities of gait and mobility: Secondary | ICD-10-CM | POA: Diagnosis present

## 2023-08-08 DIAGNOSIS — M6208 Separation of muscle (nontraumatic), other site: Secondary | ICD-10-CM | POA: Insufficient documentation

## 2023-08-08 DIAGNOSIS — M6281 Muscle weakness (generalized): Secondary | ICD-10-CM | POA: Diagnosis present

## 2023-08-08 NOTE — Therapy (Signed)
OUTPATIENT PHYSICAL THERAPY FEMALE PELVIC TREATMENT    Patient Name: Tammy Contreras MRN: [id number] DOB:05-Jun-1996, 27 y.o., female Today's Date: 08/08/2023  END OF SESSION:  PT End of Session - 08/08/23 1318     Visit Number 3    Number of Visits 9    Date for PT Re-Evaluation 09/23/23    Authorization Type Medicaid    Progress Note Due on Visit 10    PT Start Time 1316    PT Stop Time 1356    PT Time Calculation (min) 40 min    Activity Tolerance Patient tolerated treatment well    Behavior During Therapy WFL for tasks assessed/performed             Past Medical History:  Diagnosis Date   Anemia    Asthma    Past Surgical History:  Procedure Laterality Date   CESAREAN SECTION N/A 03/04/2023   Procedure: CESAREAN SECTION;  Surgeon: Linzie Collin, MD;  Location: [facility] ORS;  Service: Obstetrics;  Laterality: N/A;   WISDOM TOOTH EXTRACTION     Patient Active Problem List   Diagnosis Date Noted   Postpartum care following cesarean delivery 03/06/2023   Encounter for care or examination of lactating mother 03/06/2023   Post-operative state 03/04/2023   Cholestasis during pregnancy in third trimester 02/22/2023   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/2022   Vitamin D deficiency 08/15/2022   Anemia in pregnancy 08/15/2022   Supervision of other normal pregnancy, antepartum 07/31/2022   History of anemia 02/22/2019   History of asthma 02/22/2019    PCP: Dr. Hildred Laser  REFERRING PROVIDER: Dr. Hildred Laser  REFERRING DIAG: Diastasis recti and umbilical hernia  THERAPY DIAG:  Other abnormalities of gait and mobility  Muscle weakness (generalized)  Abnormal posture  Diastasis recti  Rationale for Evaluation and Treatment: Rehabilitation  ONSET DATE: 07/04/23 referral date   SUBJECTIVE:                                                                                                                                                                                            SUBJECTIVE STATEMENT: Pt reported she felt good with the exercises but took a few days off 2/2 niece (baby) passing away. She then had a hard time engaging TrA and needs review. Pt denied leakage or pain since last visit.   PAIN:  Are you having pain? No 08/08/23 NPRS scale: 0/10 Pain location:  but does have LBP with standing: at worst: 5/10, at best: 0/10, on average: 2/10.  Pain type: dull Pain description: intermittent   Aggravating factors: standing 8+minutes Relieving factors: sitting down  PRECAUTIONS: None  RED FLAGS: None   WEIGHT BEARING RESTRICTIONS: No  FALLS:  Has patient fallen in last 6 months? No  LIVING ENVIRONMENT: Lives with: lives with their family, lives with their spouse, lives with their son, and lives with their daughter Lives in: House/apartment Stairs: Yes: External: 3-4 steps; none Has following equipment at home: None  OCCUPATION: Sales executive full-time  PLOF: Independent  PATIENT GOALS: Top goal: reduce hernia symptoms, 2nd: strengthen pelvic floor, 3rd: repair diastasis recti   PERTINENT HISTORY:  Hx of anemia, hx of asthma in childhood, vitamin D deficiency (in pregnancy per pt), c-section 03/2023, cholestasis in pregnancy and postpartum and awaiting new blood work to re-assess.  Sexual abuse: No  BOWEL MOVEMENT: Pain with bowel movement: Yes Type of bowel movement:Frequency twice a day with pain/constipation approx. 20% of the time Fully empty rectum: Yes: most of the time Leakage: No Pads: No Fiber supplement: No  URINATION: Pain with urination: No Fully empty bladder: Yes:   Stream: Strong Urgency: No Frequency: 6-8x/day Leakage: Coughing, Sneezing, and Laughing Pads: No  INTERCOURSE: Pain with intercourse: Initial Penetration Ability to have vaginal penetration:  Yes: with pain in certain positions (pt on knees) Climax: yes Marinoff Scale: 1/3  PREGNANCY: Vaginal deliveries 2 Tearing  No C-section deliveries 1 Currently pregnant No  PROLAPSE: None   OBJECTIVE:  Note: Objective measures were completed at Evaluation unless otherwise noted.   COGNITION: Overall cognitive status: Within functional limits for tasks assessed     SENSATION: Light touch: Appears intact Proprioception: Appears intact    GAIT: Distance walked: 70' Assistive device utilized: None Level of assistance: Complete Independence Comments: wide BOS, post. Pelvic tilt, hips forward, trunk back (on her Y ligaments)  POSTURE: posterior pelvic tilt and see above.  PELVIC ALIGNMENT: post. Pelvic tilt  LUMBARAROM/PROM  A/PROM A/PROM  eval  Flexion WNL  Extension Limited ~25%  Right lateral flexion WNL  Left lateral flexion WNL  Right rotation Limited ~25%  Left rotation Limited ~25%   (Blank rows = not tested)  LOWER EXTREMITY MMT:   MMT Right eval Left eval  Hip flexion 4+/5 4+/5  Hip extension    Hip abduction    Hip adduction    Hip internal rotation 4/5 3+/5  Hip external rotation 4/5 4/5  Knee flexion 4/5 4/5  Knee extension 5/5 5/5  Ankle dorsiflexion 5/5 5/5  Ankle plantarflexion    Ankle inversion    Ankle eversion     (Blank rows = not tested)  LOWER EXTREMITY ROM: all WNL with slight pain during L hip ER.   ROM Right eval Left eval  Hip flexion    Hip extension    Hip abduction    Hip adduction    Hip internal rotation    Hip external rotation    Knee flexion    Knee extension    Ankle dorsiflexion    Ankle plantarflexion    Ankle inversion    Ankle eversion     PALPATION: Hypomobility at Tx spine with TTP. TTP over L SIJ and L glutes and hip rotators.  Diastasis recti: 4 fingers width sup and inf. To umbilicus with PT being mindful of hernia sup. To umbilicus.  PELVIC MMT:   MMT eval  Vaginal   Internal Anal Sphincter   External Anal Sphincter   Puborectalis   Diastasis Recti   (Blank rows = not tested)         TONE: WNL   PROLAPSE: Not assessed.  TODAY'S TREATMENT:                                                                                                                              DATE: 08/08/23  NMR:  Access Code: [email or web address] URL: [email or web address] Date: 08/08/2023 Prepared by: Zerita Boers  Exercises - Supine Angels  - 1 x daily - 7 x weekly - 1 sets - 10 reps - Supine Transversus Abdominis Bracing - Hands on Ground  - 1-2 x daily - 7 x weekly - 1 sets - 10 reps - Supine Pelvic Tilt  - 1 x daily - 7 x weekly - 1 sets - 10 reps - Sidelying Diaphragmatic Breathing  - 1 x daily - 7 x weekly - 1 sets - 5 reps - Seated Transversus Abdominis Bracing  - 1 x daily - 7 x weekly - 1 sets - 10 reps - Sidelying Open Book  - 1 x daily - 7 x weekly - 1 sets - 10 reps - Cat Cow  - 1 x daily - 7 x weekly - 1 sets - 5 reps Cues and demo for proper technique. S for safety. No pain reported during session with pt stating her spine felt more mobile after session. Pt reported incr. Pressure on umbilicus hernia in child's pose so PT had pt cease and perform in standing at counter instead with no reports of incr. Pressure.  SELF CARE: PATIENT EDUCATION:  Education details: PT educated pt on progressed HEP and importance of proper breath coordination to not make DR, pain or leakage worse 2/2 mismanagement of IAP. Person educated: Patient Education method: Explanation, Demonstration, Verbal cues, and Handouts Education comprehension: verbalized understanding, returned demonstration, and needs further education  HOME EXERCISE PROGRAM: See above.  ASSESSMENT:  CLINICAL IMPRESSION: Today's skilled session focused on reviewing and re-educating on TrA activation, which improved with seated position with sheet and B hip add. Squeezes. Pt denied pain during session. The following impairments continue to be noted upon exam: gait deviations, impaired posture (sway back posture), decr.  Strength likely based on subjective reports, SUI, posture and gait deviations, impaired coordination also likely based on s/s, decr. ROM (trunk rotation and back ext) and LBP. Pt would continue to benefit from skilled PT to improve deficits listed above and safety during work, caring for family and all ADLs.  OBJECTIVE IMPAIRMENTS: Abnormal gait, decreased balance, decreased coordination, decreased ROM, decreased strength, hypomobility, increased fascial restrictions, impaired flexibility, improper body mechanics, postural dysfunction, and pain.   ACTIVITY LIMITATIONS: carrying, lifting, bending, sitting, standing, squatting, transfers, bed mobility, continence, dressing, locomotion level, and caring for others  PARTICIPATION LIMITATIONS: meal prep, cleaning, laundry, interpersonal relationship, shopping, community activity, and occupation  PERSONAL FACTORS: Past/current experiences and 3+ comorbidities: see above.  are also affecting patient's functional outcome.   REHAB POTENTIAL: Good  CLINICAL DECISION MAKING: Stable/uncomplicated  EVALUATION COMPLEXITY: Low   GOALS: Goals reviewed with patient? Yes  SHORT TERM GOALS: Target date:  08/22/23  Pt will be IND in HEP to improve pain, strength, coordination. Baseline: no HEP Goal status: INITIAL  2.  Pt will complete FOTO and PT will write goal as indicated. Baseline: limited by time constraints Goal status: INITIAL  3.  Finish exam and write goals as indicated. Baseline:  limited by time constraints Goal status: INITIAL  4.  Pt will demo proper toileting posture to fully empty bladder and reduce straining during bowel movement. Baseline: not able to perform Goal status: INITIAL  5.  Pt will demonstrate proper abdominal massage and incr. In fiber rich foods to decr. Constipation.  Baseline: not performing Goal status: INITIAL    LONG TERM GOALS: Target date: 09/19/23  Pt will demonstrated improved relaxation and  contraction of PFM with coordination of breath to reduce urinary leakage to </=once/week. Baseline: twice a week Goal status: INITIAL  2.  Pt will demonstrate improved relaxation and contraction of pelvic floor muscles (PFM) with coordination of breath to decr. Pain with intercourse with spouse. Baseline: pain with initial penetration Goal status: INITIAL  3.  Pt will demonstrate proper scar mobilization of c-section scar to decr. Pain and PFM tension. Baseline: not performing Goal status: INITIAL  4.  Assess for diastasis recti and write goal as indicated. Baseline: limited by time constraints Goal status: INITIAL  5.  Pt will improve posture and hip/LE strength to be able to stand for 15+ minutes without incr. In LBP. Baseline: stand for 8+ minutes while washing dishes with 5/10 pain. Goal status: INITIAL   PLAN: continue to review HEP and progress as tolerated, manual therapy prn (internal assessment next time)  PT FREQUENCY: 1x/week  PT DURATION: 8 weeks  PLANNED INTERVENTIONS: 97164- PT Re-evaluation, 97110-Therapeutic exercises, 97530- Therapeutic activity, 97112- Neuromuscular re-education, 97535- Self Care, [id number]- Manual therapy, [phone]- Gait training, Patient/Family education, Balance training, Dry Needling, Joint manipulation, Spinal manipulation, Spinal mobilization, Scar mobilization, Moist heat, and Biofeedback    Terrian Ridlon L, PT 08/08/2023, 1:19 PM  Zerita Boers, PT,DPT 08/08/23 1:19 PM Phone: [phone] Fax: [phone]

## 2023-08-08 NOTE — Patient Instructions (Signed)
Thoracic standing extension: with hands on counter, knees slightly bent, flatten back and neck in neutral (hips at 90 degrees). Breathe into back 5 reps, you can also moves hands to one side and breathe into opposite side and repeat on other side.

## 2023-08-12 ENCOUNTER — Encounter: Payer: Self-pay | Admitting: Obstetrics and Gynecology

## 2023-08-12 DIAGNOSIS — K429 Umbilical hernia without obstruction or gangrene: Secondary | ICD-10-CM

## 2023-08-12 DIAGNOSIS — M6208 Separation of muscle (nontraumatic), other site: Secondary | ICD-10-CM

## 2023-08-22 ENCOUNTER — Ambulatory Visit: Payer: Medicaid Other

## 2023-08-28 ENCOUNTER — Ambulatory Visit: Payer: Medicaid Other | Admitting: Obstetrics & Gynecology

## 2023-08-28 ENCOUNTER — Other Ambulatory Visit (HOSPITAL_COMMUNITY)
Admission: RE | Admit: 2023-08-28 | Discharge: 2023-08-28 | Disposition: A | Discharge status: Home / Self Care | Payer: Medicaid Other | Source: Ambulatory Visit | Attending: Obstetrics & Gynecology | Admitting: Obstetrics & Gynecology

## 2023-08-28 ENCOUNTER — Encounter: Payer: Self-pay | Admitting: Obstetrics & Gynecology

## 2023-08-28 VITALS — BP 109/76 | HR 89 | Ht 63.0 in | Wt 155.0 lb

## 2023-08-28 DIAGNOSIS — N898 Other specified noninflammatory disorders of vagina: Secondary | ICD-10-CM | POA: Diagnosis present

## 2023-08-28 MED ORDER — DICLOXACILLIN SODIUM 500 MG PO CAPS: 500.0000 mg | ORAL_CAPSULE | Freq: Four times a day (QID) | ORAL | 0 refills | Status: DC

## 2023-08-28 MED ORDER — FLUCONAZOLE 150 MG PO TABS: 150.0000 mg | ORAL_TABLET | Freq: Once | ORAL | 0 refills | Status: AC

## 2023-08-28 NOTE — Progress Notes (Signed)
    GYNECOLOGY PROGRESS NOTE  Subjective:    Patient ID: Tammy Contreras, female    DOB: 1996/09/17, 27 y.o.   MRN: [id number]  HPI  Patient is a 27 y.o. P3 here with 2 concerns. 1) vaginal itching and white discharge. She has not used any OTC treatments at this time.  2) She feels a lump in her cesarean scar. She had this surgery 6 months ago.  The following portions of the patient's history were reviewed and updated as appropriate: allergies, current medications, past family history, past medical history, past social history, past surgical history, and problem list.  Review of Systems Pertinent items are noted in HPI.  Her pap was normal 08/2022. She is using withdrawal for contraception. She is pumping breast milk for baby.  Objective:   Blood pressure 109/76, pulse 89, height 5\' 3"  (1.6 m), weight 155 lb (70.3 kg), currently breastfeeding. Body mass index is 27.46 kg/m. Well nourished, well hydrated female no apparent distress  Left 2 cm of well healed incision has a small keloid and a stitch noted At the Right end of the incision is a small infected area (see photo under media) I cut the stitch.    Assessment:   Vaginal itching Skin infection in incision  Plan:   Diclox x 7 days. If not better then, come back. Diflucan prn Aptima sent

## 2023-08-29 LAB — CERVICOVAGINAL ANCILLARY ONLY
Bacterial Vaginitis (gardnerella): POSITIVE — AB
Candida Glabrata: NEGATIVE
Candida Vaginitis: POSITIVE — AB
Comment: NEGATIVE
Comment: NEGATIVE
Comment: NEGATIVE

## 2023-09-03 ENCOUNTER — Telehealth: Payer: Self-pay

## 2023-09-03 DIAGNOSIS — B9689 Other specified bacterial agents as the cause of diseases classified elsewhere: Secondary | ICD-10-CM

## 2023-09-03 MED ORDER — METRONIDAZOLE 500 MG PO TABS: 500.0000 mg | ORAL_TABLET | Freq: Two times a day (BID) | ORAL | 0 refills | Status: DC

## 2023-09-03 NOTE — Telephone Encounter (Signed)
Patient advised she was seen 11/26. She has questions about rx's that were sent. Advised per Dr. Marice Potter 08/28/23 visit note: dicloxacillin (DYNAPEN) 500 MG capsule sent for skin infection and Diflucan sent for yeast infection. Patient states she has not picked up either rx. She has seen her swab results. Results reviewed. Positive for yeast and BV. Advised will send rx for metronidazole for BV. She will p/u all rx's. Patient is currently breast feeding. She is aware to abstain from alcohol and intercourse during treatment.

## 2023-09-17 ENCOUNTER — Encounter: Payer: Self-pay | Admitting: Plastic Surgery

## 2023-09-17 ENCOUNTER — Ambulatory Visit (INDEPENDENT_AMBULATORY_CARE_PROVIDER_SITE_OTHER): Payer: Self-pay | Admitting: Plastic Surgery

## 2023-09-17 VITALS — BP 99/66 | HR 72 | Ht 63.0 in | Wt 156.8 lb

## 2023-09-17 DIAGNOSIS — K429 Umbilical hernia without obstruction or gangrene: Secondary | ICD-10-CM

## 2023-09-17 DIAGNOSIS — M6208 Separation of muscle (nontraumatic), other site: Secondary | ICD-10-CM

## 2023-09-19 ENCOUNTER — Ambulatory Visit: Payer: Medicaid Other | Attending: Obstetrics and Gynecology

## 2023-09-27 NOTE — Progress Notes (Signed)
Referring Provider Hildred Laser, MD [address] [place],  [place] [address]   CC:  Chief Complaint  Patient presents with   Advice Only      Tammy Contreras is an 27 y.o. female.  HPI: Tammy Contreras is a 27 year old female who is referred today for evaluation and management of a rectus diastases.  Patient has had 3 pregnancies and noted a significant increase in the size of her rectus diastases with her last child.  This child was born via C-section in June 2024.  She has seen physical therapy due to ongoing low back pain and difficulty with bowel movements.  She is interested in having the rectus diastases repaired.  No Known Allergies  Outpatient Encounter Medications as of 09/17/2023  Medication Sig   Prenatal Vit-Fe Fumarate-FA (MULTIVITAMIN-PRENATAL) 27-0.8 MG TABS tablet Take 3 tablets by mouth daily at 12 noon.   [DISCONTINUED] dicloxacillin (DYNAPEN) 500 MG capsule Take 1 capsule (500 mg total) by mouth 4 (four) times daily.   [DISCONTINUED] metroNIDAZOLE (FLAGYL) 500 MG tablet Take 1 tablet (500 mg total) by mouth 2 (two) times daily.   No facility-administered encounter medications on file as of 09/17/2023.     Past Medical History:  Diagnosis Date   Anemia    Asthma     Past Surgical History:  Procedure Laterality Date   CESAREAN SECTION N/A 03/04/2023   Procedure: CESAREAN SECTION;  Surgeon: Linzie Collin, MD;  Location: [facility] ORS;  Service: Obstetrics;  Laterality: N/A;   WISDOM TOOTH EXTRACTION      Family History  Problem Relation Age of Onset   Hypertension Mother        borderline   Diabetes Mother        Borderline   Hyperlipidemia Mother        borderline   Healthy Father    Healthy Sister    Asthma Brother    Diabetes Maternal Grandfather    Hypertension Maternal Grandfather    Healthy Paternal Grandmother    Hyperlipidemia Paternal Grandfather    Diabetes Paternal Grandfather    Hypertension Paternal Grandfather    Cataracts  Paternal Grandfather    Breast cancer Neg Hx    Ovarian cancer Neg Hx    Colon cancer Neg Hx     Social History   Social History Narrative   ** Merged History Encounter **         Review of Systems General: Denies fevers, chills, weight loss CV: Denies chest pain, shortness of breath, palpitations Abdomen: Patient complains of weakness and protuberance of the abdomen and an obvious bulge when she sits up  Physical Exam    09/17/2023   10:42 AM 08/28/2023    8:47 AM 07/04/2023    9:06 AM  Vitals with BMI  Height 5\' 3"  5\' 3"  5\' 3"   Weight 156 lbs 13 oz 155 lbs 164 lbs 8 oz  BMI 27.78 27.46 29.15  Systolic 99 109 96  Diastolic 66 76 69  Pulse 72 89 69    General:  No acute distress,  Alert and oriented, Non-Toxic, Normal speech and affect Abdomen: Patient has extensive stretch marks across the entire abdominal wall.  She has a rectus diastases which is approximately 5 to 7 cm in width at its widest point.  She also has a very small umbilical hernia which is reducible.  There is a small amount of excess skin on the lowest point of the abdomen above the symphysis pubis. Mammogram: Not applicable  Assessment/Plan Rectus diastases: Patient has a moderate rectus diastases after pregnancy.  We discussed rectus diastases at length including the physiological reason for widening of the linea alba and repair of rectus diastases.  She understands that there is no literature to support repair of a rectus diastases to treat low back pain or to improve bowel movements.  In most cases repair of rectus diastases is considered a cosmetic procedure.  She has however ask that I submit this for insurance consideration.  We discussed abdominal plasties at length including the location of the low transverse incision the periumbilical incision the elevation and resection of the anterior abdominal wall skin umbilical transposition and rectus plication.  We discussed the risks of bleeding, infection, and  seroma formation as well as the possibility of cosmetic dissatisfaction.  She understands that there is no way to completely remove all of the skin that has been stretched from her pregnancy.  We discussed the use of compression and drains postoperatively.  She understands her postoperative limitations will include no heavy lifting greater than 20 pounds, no vigorous activity, no submerging incisions in water.  She understands that she will need to wear a compressive garment for 6 weeks postoperatively.  She will also have 2 drains postoperatively.  The patient does have a small umbilical hernia and I will put in a consult for general surgery for repair.  If she elects to undergo abdominoplasty this can be done at the same time.  Does however carry a slightly increased risk of loss of the umbilicus due to ischemia.  All questions were answered to her satisfaction.  Photographs were obtained today with her consent.  Will submit her for an abdominoplasty at her request.  Santiago Glad 09/27/2023, 4:58 PM

## 2023-10-16 ENCOUNTER — Other Ambulatory Visit: Payer: Self-pay

## 2023-10-16 ENCOUNTER — Ambulatory Visit: Payer: Medicaid Other | Attending: Obstetrics and Gynecology

## 2023-10-16 DIAGNOSIS — R293 Abnormal posture: Secondary | ICD-10-CM | POA: Diagnosis present

## 2023-10-16 DIAGNOSIS — M6208 Separation of muscle (nontraumatic), other site: Secondary | ICD-10-CM | POA: Diagnosis present

## 2023-10-16 DIAGNOSIS — M6281 Muscle weakness (generalized): Secondary | ICD-10-CM

## 2023-10-16 DIAGNOSIS — R2689 Other abnormalities of gait and mobility: Secondary | ICD-10-CM | POA: Diagnosis present

## 2023-10-16 NOTE — Therapy (Signed)
 OUTPATIENT PHYSICAL THERAPY FEMALE PELVIC TREATMENT /recert   Patient Name: Tammy Contreras MRN: [id number] DOB:08/23/1996, 28 y.o., female Today's Date: 10/16/2023  END OF SESSION:  PT End of Session - 10/16/23 1403     Visit Number 4    Number of Visits 9    Date for PT Re-Evaluation 09/23/23    Authorization Type Medicaid    Progress Note Due on Visit 10    PT Start Time 1400    PT Stop Time 1444    PT Time Calculation (min) 44 min    Activity Tolerance Patient tolerated treatment well    Behavior During Therapy WFL for tasks assessed/performed             Past Medical History:  Diagnosis Date   Anemia    Asthma    Past Surgical History:  Procedure Laterality Date   CESAREAN SECTION N/A 03/04/2023   Procedure: CESAREAN SECTION;  Surgeon: Janit Alm Agent, MD;  Location: [facility] ORS;  Service: Obstetrics;  Laterality: N/A;   WISDOM TOOTH EXTRACTION     Patient Active Problem List   Diagnosis Date Noted   Postpartum care following cesarean delivery 03/06/2023   Encounter for care or examination of lactating mother 03/06/2023   Post-operative state 03/04/2023   Cholestasis during pregnancy in third trimester 02/22/2023   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/2022   Vitamin D  deficiency 08/15/2022   Anemia in pregnancy 08/15/2022   Supervision of other normal pregnancy, antepartum 07/31/2022   History of anemia 02/22/2019   History of asthma 02/22/2019    PCP: Dr. Connell Davies  REFERRING PROVIDER: Dr. Connell Davies  REFERRING DIAG: Diastasis recti and umbilical hernia  THERAPY DIAG:  Other abnormalities of gait and mobility  Muscle weakness (generalized)  Abnormal posture  Diastasis recti  Rationale for Evaluation and Treatment: Rehabilitation  ONSET DATE: 07/04/23 referral date   SUBJECTIVE:                                                                                                                                                                                            SUBJECTIVE STATEMENT: 10/16/23 Pt reported she took time off to see surgeon re: umbilical hernia. She does not wish to have surgery to fix the diastasis recti and could not be completely fixed. She has an appt. With a different surgeon for a second opinion re: hernia. She has been performing HEP but not typically twice a day. She tried exercises standing up and could feel TrA contracting better. She reported pain with intercourse has stopped with exercises. Pt stopped breastfeeding a month ago. She still gets up at  least twice a night to void. No longer has pain. Pt reported she has difficulty with incr. Intensity exercises (rotational exercises and bending forward). Carrying her four year old causes discomfort in her stomach.    PAIN:  Are you having pain? No 10/16/23 NPRS scale: 0/10 Pain location:  but does have LBP with standing: at worst: 0/10, at best: 0/10, on average:0/10.  Pain type: dull Pain description: intermittent   Aggravating factors: standing 8+minutes Relieving factors: sitting down  PRECAUTIONS: None  RED FLAGS: None   WEIGHT BEARING RESTRICTIONS: No  FALLS:  Has patient fallen in last 6 months? No  LIVING ENVIRONMENT: Lives with: lives with their family, lives with their spouse, lives with their son, and lives with their daughter Lives in: House/apartment Stairs: Yes: External: 3-4 steps; none Has following equipment at home: None  OCCUPATION: sales executive full-time  PLOF: Independent  PATIENT GOALS: Top goal: reduce hernia symptoms, 2nd: strengthen pelvic floor, 3rd: repair diastasis recti, 10/16/23: overall strengthening core muscles.  PERTINENT HISTORY:  Hx of anemia, hx of asthma in childhood, vitamin D  deficiency (in pregnancy per pt), c-section 03/2023, cholestasis in pregnancy and postpartum and awaiting new blood work to re-assess.  Sexual abuse: No  BOWEL MOVEMENT: Pain with bowel movement: Yes Type of bowel  movement:Frequency twice a day with pain/constipation approx. 20% of the time Fully empty rectum: Yes: most of the time Leakage: No Pads: No Fiber supplement: No  URINATION: Pain with urination: No Fully empty bladder: Yes:   Stream: Strong Urgency: No Frequency: 6-8x/day Leakage: Coughing, Sneezing, and Laughing Pads: No  INTERCOURSE: Pain with intercourse: Initial Penetration Ability to have vaginal penetration:  Yes: with pain in certain positions (pt on knees) Climax: yes Marinoff Scale: 1/3  PREGNANCY: Vaginal deliveries 2 Tearing No C-section deliveries 1 Currently pregnant No  PROLAPSE: None   OBJECTIVE:  Note: Objective measures were completed at Evaluation unless otherwise noted.   COGNITION: Overall cognitive status: Within functional limits for tasks assessed     SENSATION: Light touch: Appears intact Proprioception: Appears intact    GAIT: Distance walked: 26' Assistive device utilized: None Level of assistance: Complete Independence Comments: wide BOS, post. Pelvic tilt, hips forward, trunk back (on her Y ligaments)  POSTURE: posterior pelvic tilt and see above.  PELVIC ALIGNMENT: post. Pelvic tilt  LUMBARAROM/PROM:   A/PROM 10/16/23 A/PROM  eval  Flexion Limited by 25%  Extension WNL  Right lateral flexion Limited by 25%  Left lateral flexion WNL  Right rotation WNL  Left rotation WNL   (Blank rows = not tested)  LOWER EXTREMITY MMT:   MMT-10/16/23 Right eval Left eval  Hip flexion 4+/5 4+/5  Hip extension    Hip abduction 3+/5 3+/5  Hip adduction 4-/5 4-/5  Hip internal rotation 4-/5 4/5 mild discomfort  Hip external rotation 4-/5 4/5  Knee flexion 4/5 4/5  Knee extension 5/5 5/5  Ankle dorsiflexion 5/5 5/5  Ankle plantarflexion    Ankle inversion    Ankle eversion     (Blank rows = not tested)  LOWER EXTREMITY ROM: 10/16/23 all WNL except for L IR with slight pain noted per pt.   ROM Right eval Left eval  Hip  flexion    Hip extension    Hip abduction    Hip adduction    Hip internal rotation    Hip external rotation    Knee flexion    Knee extension    Ankle dorsiflexion    Ankle plantarflexion  Ankle inversion    Ankle eversion     PALPATION: 10/16/23: Hypomobility at upper Tx spine with PAMs. Diastasis recti: 4 fingers width sup and inf. To umbilicus with PT being mindful of hernia sup. To umbilicus.  10/16/23: Diastasis recti: 3-3.5 fingers width sup. and at umbilicus and approx. 1-1.5 fingers width inf. To umbilicus.   PELVIC MMT:  See above MMT eval  Vaginal   Internal Anal Sphincter   External Anal Sphincter   Puborectalis   Diastasis Recti   (Blank rows = not tested)        TONE: See above.   PROLAPSE: None per pt.   TODAY'S TREATMENT:                                                                                                                              DATE: 10/16/23  NMR:  PT performed re-eval of ROM, MMT, palpation and DR. See ABOVE all labled 10/16/23. Encouraged pt to perform HEP only for now. No rotational movements until sagittal and frontal plane motions are not painful.  Patient Education - Scar Massage Cues and demo to reduce N/T around c-section scar area.  SELF CARE: PATIENT EDUCATION:  Education details: PT on exam findings and discussed goal progress and new POC. Person educated: Patient Education method: Explanation, Demonstration, Verbal cues, and Handouts Education comprehension: verbalized understanding, returned demonstration, and needs further education  HOME EXERCISE PROGRAM: See above.  ASSESSMENT:  CLINICAL IMPRESSION: Today's skilled session focused on reviewing goals (all met, partially met or ongoing) as pt missed two months of PT while waiting to see surgeon re: umbilical hernia and diastasis recti. No limitations noted and pt awaiting another opinion. In the meantime, pt has been performing HEP and diastasis recti has begun  to improve. The following impairments continue to be noted upon exam: gait deviations, impaired posture, decr. Strength likely based on subjective reports, SUI has ceased, posture and gait deviations, impaired coordination also likely based on s/s, decr. ROM (trunk rotation and trunk flex and L hip IR) and intermittent abdominal discomfort. Pt would continue to benefit from skilled PT to improve deficits listed above and safety during work, caring for family and all ADLs. Therefore, PT requesting add'l appt's to progress towards unmet goals.  OBJECTIVE IMPAIRMENTS: Abnormal gait, decreased balance, decreased coordination, decreased ROM, decreased strength, hypomobility, increased fascial restrictions, impaired flexibility, improper body mechanics, postural dysfunction, and pain.   ACTIVITY LIMITATIONS: carrying, lifting, bending, sitting, standing, squatting, transfers, bed mobility, continence, dressing, locomotion level, and caring for others  PARTICIPATION LIMITATIONS: meal prep, cleaning, laundry, interpersonal relationship, shopping, community activity, and occupation  PERSONAL FACTORS: Past/current experiences and 3+ comorbidities: see above.  are also affecting patient's functional outcome.   REHAB POTENTIAL: Good  CLINICAL DECISION MAKING: Stable/uncomplicated  EVALUATION COMPLEXITY: Low   GOALS: Goals reviewed with patient? Yes  SHORT TERM GOALS: Target date: 08/22/23. NEW POC: 11/13/23  Pt will be IND in HEP to improve  pain, strength, coordination. Baseline: no HEP Goal status: MET  2.  Pt will complete FOTO and PT will write goal as indicated. Baseline: limited by time constraints Goal status: DEFERRED  3.  Finish exam and write goals as indicated. Baseline:  limited by time constraints Goal status: INITIAL  4.  Pt will demo proper toileting posture to fully empty bladder and reduce straining during bowel movement. Baseline: not able to perform Goal status: PARTIALLY  MET  5.  Pt will demonstrate proper abdominal massage and incr. In fiber rich foods to decr. Constipation.  Baseline: not performing. 10/16/23: incr. Fiber rich foods so no longer constipated. Goal status: MET    LONG TERM GOALS: Target date: 09/19/23. NEW POC: 12/11/23  Pt will demonstrated improved relaxation and contraction of PFM with coordination of breath to reduce urinary leakage to </=once/week. Baseline: twice a week. 10/16/23: no leakage Goal status: MET  2.  Pt will demonstrate improved relaxation and contraction of pelvic floor muscles (PFM) with coordination of breath to decr. Pain with intercourse with spouse. Baseline: pain with initial penetration. 10/16/23: no pain with intercourse Goal status: MET  3.  Pt will demonstrate proper scar mobilization of c-section scar to decr. Pain and PFM tension. Baseline: not performing Goal status: DEFERRED  4.  Assess for diastasis recti and write goal as indicated. Baseline: limited by time constraints Goal status: MET  5.  Pt will improve posture and hip/LE strength to be able to stand for 15+ minutes without incr. In LBP. Baseline: stand for 8+ minutes while washing dishes with 5/10 pain. 10/16/23: pt doesn't believe she has any more pain while standing but does have pain when she's standing on one side only. Goal status: PARTIALLY MET   PLAN: continue to review HEP and progress as tolerated, manual therapy prn (internal assessment prn)  PT FREQUENCY: 1x/week  PT DURATION: 8 weeks  PLANNED INTERVENTIONS: 97164- PT Re-evaluation, 97110-Therapeutic exercises, 97530- Therapeutic activity, 97112- Neuromuscular re-education, 97535- Self Care, [id number]- Manual therapy, [phone]- Gait training, Patient/Family education, Balance training, Dry Needling, Joint manipulation, Spinal manipulation, Spinal mobilization, Scar mobilization, Moist heat, and Biofeedback    Maimuna Leaman L, PT 10/16/2023, 2:03 PM  Delon Pinal, PT,DPT 10/16/23  2:03 PM Phone: [phone] Fax: [phone]

## 2023-10-23 ENCOUNTER — Ambulatory Visit: Payer: Medicaid Other

## 2023-10-30 ENCOUNTER — Ambulatory Visit: Payer: Medicaid Other

## 2023-10-30 ENCOUNTER — Encounter: Payer: Self-pay | Admitting: Plastic Surgery

## 2023-11-06 ENCOUNTER — Ambulatory Visit: Payer: Medicaid Other

## 2023-11-09 NOTE — Progress Notes (Deleted)
 Celso Amy, PA-C [address]  [address]  [place], [place] [address]  Main: [phone]  Fax: [phone]   Primary Care Physician: Patient, No Pcp Per  Primary Gastroenterologist:  Celso Amy, PA-C / Dr. Wyline Mood    CC: F/U Cholestasis during Pregnancy  HPI: Tammy Contreras is a 28 y.o. female returns for 41-month follow-up of cholestasis during pregnancy.  She gave birth to her third child by C-section 03/04/2023.  During the 3rd trimester of her pregnancy she developed itching and cholestasis.  Bile acid of 15.  Treated with ursodiol.  Post partum she was told to follow-up with GI.  Currently she has no abdominal pain.    Lab 02/16/23 showed Bile Acid 15.  Elevated Alk Phos, otherwise Normal LFTs. Lab 03/09/23 showed Normal LFTs. Lab 04/17/23 showed Bile Acid 25.    05/2023 labs: Mildly elevated ALT 69.  All other LFTs normal.  Bile acid 24.7.  Acute hepatitis A, B, and C labs negative.  Immune to hepatitis A.  Not immune to hepatitis B.  Celiac and HIV labs negative.  Iron, ceruloplasmin, ANA, AMA, ASMA, alpha-1 antitrypsin, all normal/negative.    Abdominal pelvic CT 03/09/2023 showed mild diffuse fatty liver, 1.8 cm lesion in the left lateral liver lobe.  Suspected to be focal nodular hyperplasia.  Hepatic adenoma less likely.  Consider follow-up with MRI.  Normal gallbladder and bile ducts.  Normal pancreas, spleen.  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  2 other smaller 7mm and 4 mm liver lesions thought to reflect intrahepatic shunts, focal fatty sparing, or small adenoma /focal nodular hyperplasia.  Follow-up repeat liver MRI/MRCP was recommended in 6 months.   Current Outpatient Medications  Medication Sig Dispense Refill   Multiple Vitamins-Minerals (WOMENS 50+ MULTI VITAMIN PO) Take 100 mg by mouth daily.     Prenatal Vit-Fe  Fumarate-FA (MULTIVITAMIN-PRENATAL) 27-0.8 MG TABS tablet Take 3 tablets by mouth daily at 12 noon. (Patient not taking: Reported on 10/16/2023)     No current facility-administered medications for this visit.    Allergies as of 11/12/2023   (No Known Allergies)    Past Medical History:  Diagnosis Date   Anemia    Asthma     Past Surgical History:  Procedure Laterality Date   CESAREAN SECTION N/A 03/04/2023   Procedure: CESAREAN SECTION;  Surgeon: Linzie Collin, MD;  Location: [facility] ORS;  Service: Obstetrics;  Laterality: N/A;   WISDOM TOOTH EXTRACTION      Review of Systems:    All systems reviewed and negative except where noted in HPI.   Physical Examination:   There were no vitals taken for this visit.  General: Well-nourished, well-developed in no acute distress.  Lungs: Clear to auscultation bilaterally. Non-labored. Heart: Regular rate and rhythm, no murmurs rubs or gallops.  Abdomen: Bowel sounds are normal; Abdomen is Soft; No hepatosplenomegaly, masses or hernias;  No Abdominal Tenderness; No guarding or rebound tenderness. Neuro: Alert and oriented x 3.  Grossly intact.  Psych: Alert and cooperative, normal mood and affect.   Imaging Studies: No results found.  Assessment and Plan:   [name] is a 28 y.o. y/o female returns for f/u of:   Cholestasis during Pregnancy: Labs 6 months ago showed no evidence of celiac, HIV, autoimmune hepatitis, PBC, hemochromatosis, Wilson's disease, alpha-1 antitrypsin deficiency, or acute viral hepatitis A/B/C.  Immune to hepatitis A.  Not immune to hepatitis B. Hepatic panel lab Give hepatitis B vaccine  2.  Liver lesions: Intrahepatic shunts versus focal fatty sparing versus small adenoma/focal nodular hyperplasia.  Scheduling 18-month repeat liver MRI/MRCP with and without contrast for follow-up.  3.  Hepatic steatosis  Recommend a low-fat diet, regular exercise, and weight loss. Patient education handout  about fatty liver disease was given and discussed from up-to-date.      Celso Amy, PA-C  Follow up ***  BP check ***

## 2023-11-12 ENCOUNTER — Ambulatory Visit: Payer: Medicaid Other | Admitting: Physician Assistant

## 2023-11-13 ENCOUNTER — Ambulatory Visit: Payer: Medicaid Other

## 2023-12-07 ENCOUNTER — Other Ambulatory Visit (HOSPITAL_COMMUNITY)
Admission: RE | Admit: 2023-12-07 | Discharge: 2023-12-07 | Disposition: A | Discharge status: Home / Self Care | Source: Ambulatory Visit

## 2023-12-07 ENCOUNTER — Ambulatory Visit: Payer: Medicaid Other

## 2023-12-07 VITALS — BP 92/55 | HR 63 | Wt 157.5 lb

## 2023-12-07 DIAGNOSIS — N898 Other specified noninflammatory disorders of vagina: Secondary | ICD-10-CM

## 2023-12-07 MED ORDER — FLUCONAZOLE 150 MG PO TABS: 150.0000 mg | ORAL_TABLET | ORAL | 3 refills | Status: DC

## 2023-12-07 NOTE — Progress Notes (Signed)
   GYN ENCOUNTER  Encounter for vaginal itching/irritation  Subjective  HPI: Tammy Contreras is a 28 y.o. [id number] who presents today for vaginal irritation and itching.   Has been having multiple yeast infections during pregnancy and then had another one about two months ago. Is concerned about having ongoing yeast infections. The odor is what bothers her the most.   Diagnosed with cholestasis in pregnancy and had continued elevated bile acids. Saw GI doctor postpartum and did an extended panel of blood work at that time. Recommended follow up in 6 months but has not been able to be seen due to difficulty scheduling.   Past Medical History:  Diagnosis Date   Anemia    Asthma    Past Surgical History:  Procedure Laterality Date   CESAREAN SECTION N/A 03/04/2023   Procedure: CESAREAN SECTION;  Surgeon: Linzie Collin, MD;  Location: [facility] OR[facility];  Service: Obstetrics;  Laterality: N/A;   WISDOM TOOTH EXTRACTION     OB History     Gravida  3   Para  3   Term  3   Preterm  0   AB  0   Living  3      SAB  0   IAB  0   Ectopic  0   Multiple  0   Live Births  3          No Known Allergies  Review of Systems  12 point ROS negative except for pertinent positives noted in HPO above.   Objective  BP (!) 92/55   Pulse 63   Wt 157 lb 8 oz (71.4 kg)   LMP 12/07/2023 (Exact Date)   Breastfeeding No   BMI 27.90 kg/m   Physical examination GENERAL APPEARANCE: alert, well appearing LUNGS: normal work of breathing HEART: normal heart rate Assessment/Plan - Self-swab for yeast and BV. Provided with Rx for diflucan, recommended 3 doses. Discussed other options with recurrent yeast, desires to try treatment this time and will follow up if needed.  - Recommended follow up with GI doctor for cholestasis concerns.    Tammy Contreras, CNM  12/07/23 10:22 AM

## 2023-12-10 LAB — CERVICOVAGINAL ANCILLARY ONLY
Bacterial Vaginitis (gardnerella): POSITIVE — AB
Candida Glabrata: NEGATIVE
Candida Vaginitis: POSITIVE — AB
Chlamydia: NEGATIVE
Comment: NEGATIVE
Comment: NEGATIVE
Comment: NEGATIVE
Comment: NEGATIVE
Comment: NEGATIVE
Comment: NORMAL
Neisseria Gonorrhea: NEGATIVE
Trichomonas: NEGATIVE

## 2023-12-11 ENCOUNTER — Other Ambulatory Visit: Payer: Self-pay

## 2023-12-11 MED ORDER — METRONIDAZOLE 500 MG PO TABS: 500.0000 mg | ORAL_TABLET | Freq: Two times a day (BID) | ORAL | 0 refills | Status: AC

## 2023-12-13 ENCOUNTER — Ambulatory Visit: Payer: Medicaid Other | Admitting: Surgical

## 2023-12-13 ENCOUNTER — Encounter: Payer: Self-pay | Admitting: Surgical

## 2023-12-13 VITALS — BP 106/66 | HR 78 | Ht 63.0 in | Wt 156.2 lb

## 2023-12-13 DIAGNOSIS — M6208 Separation of muscle (nontraumatic), other site: Secondary | ICD-10-CM

## 2023-12-13 DIAGNOSIS — K429 Umbilical hernia without obstruction or gangrene: Secondary | ICD-10-CM

## 2023-12-13 NOTE — Progress Notes (Signed)
 Patient ID: Tammy Contreras, female    DOB: 08-May-1996, 28 y.o.   MRN: [id number]  Chief Complaint  Patient presents with   Pre-op Exam     ICD-10-CM   1. Umbilical hernia without obstruction and without gangrene  K42.9     2. Rectus diastasis  M62.08       History of Present Illness: Tammy Contreras is a 28 y.o.  female  with a history of rectus diastases.  She presents for preoperative evaluation for upcoming procedure, Abdominoplasty, scheduled for 12/31/2023 with Dr.  Ladona Ridgel .  This is a combination case with umbilical hernia repair with general surgery Dr. Dossie Der  Patient reports she has not had anesthesia before.  She has no known family complications related to anesthesia. No history of DVT/PE.  No family history of DVT/PE.  No family or personal history of bleeding or clotting disorders.  Patient is not currently taking any blood thinners.  No history of CVA/MI.   She reports she had asthma as a child, no longer requires treatment for asthma.  She does report that she is currently being treated for yeast infection/BV, reports that symptoms significantly have improved after starting treatment.  She reports that she is followed by GI, had a history of cholestasis while pregnant, reports recently her symptoms have significantly improved.  Summary of Previous Visit: 28 year old female with history of 3 pregnancies and significant increase size of her rectus diastases with last child.  She has a history of C-section.  Patient has rectus diastases, 5 to 7 cm in width at widest point.  She also has an umbilical hernia that is reducible.  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  We discussed that it would not be unreasonable to be out for a total of 6 weeks.  PMH Significant for: Cholestasis while pregnant, history of asthma as a child, umbilical hernia, rectus diastases.  Reports history of fatty liver  Recent  diagnosis of BV and yeast infection currently undergoing treatment, noting significant improvement in her symptoms.  She does report today that she may have difficulty securing childcare for her surgery, so she may need to reschedule.   Past Medical History: Allergies: No Known Allergies  Current Medications:  Current Outpatient Medications:    fluconazole (DIFLUCAN) 150 MG tablet, Take 1 tablet (150 mg total) by mouth every 3 (three) days. For three doses, Disp: 3 tablet, Rfl: 3   metroNIDAZOLE (FLAGYL) 500 MG tablet, Take 1 tablet (500 mg total) by mouth 2 (two) times daily for 7 days., Disp: 14 tablet, Rfl: 0   Multiple Vitamins-Minerals (WOMENS 50+ MULTI VITAMIN PO), Take 100 mg by mouth daily., Disp: , Rfl:    Prenatal Vit-Fe Fumarate-FA (MULTIVITAMIN-PRENATAL) 27-0.8 MG TABS tablet, Take 3 tablets by mouth daily at 12 noon. (Patient not taking: Reported on 10/16/2023), Disp: , Rfl:   Past Medical Problems: Past Medical History:  Diagnosis Date   Anemia    Asthma     Past Surgical History: Past Surgical History:  Procedure Laterality Date   CESAREAN SECTION N/A 03/04/2023   Procedure: CESAREAN SECTION;  Surgeon: Linzie Collin, MD;  Location: [facility] ORS;  Service: Obstetrics;  Laterality: N/A;   WISDOM TOOTH EXTRACTION      Social History: Social History   Socioeconomic History   Marital status: Married    Spouse name: Leretha Pol   Number of children: 2   Years of education: 5  Highest education level: Not on file  Occupational History   Occupation: Sales executive  Tobacco Use   Smoking status: Never   Smokeless tobacco: Never  Vaping Use   Vaping status: Never Used  Substance and Sexual Activity   Alcohol use: Not Currently   Drug use: Never   Sexual activity: Yes    Partners: Male    Birth control/protection: None  Other Topics Concern   Not on file  Social History Narrative   ** Merged History Encounter **       Social Drivers of Health    Financial Resource Strain: Low Risk  (07/31/2022)   Overall Financial Resource Strain (CARDIA)    Difficulty of Paying Living Expenses: Not very hard  Food Insecurity: No Food Insecurity (03/04/2023)   Hunger Vital Sign    Worried About Running Out of Food in the Last Year: Never true    Ran Out of Food in the Last Year: Never true  Transportation Needs: No Transportation Needs (03/04/2023)   PRAPARE - Administrator, Civil Service (Medical): No    Lack of Transportation (Non-Medical): No  Physical Activity: Inactive (07/31/2022)   Exercise Vital Sign    Days of Exercise per Week: 0 days    Minutes of Exercise per Session: 0 min  Stress: No Stress Concern Present (07/31/2022)   [employer] of Occupational Health - Occupational Stress Questionnaire    Feeling of Stress : Not at all  Social Connections: Moderately Integrated (07/31/2022)   Social Connection and Isolation Panel [NHANES]    Frequency of Communication with Friends and Family: More than three times a week    Frequency of Social Gatherings with Friends and Family: Twice a week    Attends Religious Services: More than 4 times per year    Active Member of [employer] or Organizations: No    Attends Banker Meetings: Never    Marital Status: Married  Catering manager Violence: Not At Risk (07/31/2022)   Humiliation, Afraid, Rape, and Kick questionnaire    Fear of Current or Ex-Partner: No    Emotionally Abused: No    Physically Abused: No    Sexually Abused: No    Family History: Family History  Problem Relation Age of Onset   Hypertension Mother        borderline   Diabetes Mother        Borderline   Hyperlipidemia Mother        borderline   Healthy Father    Healthy Sister    Asthma Brother    Diabetes Maternal Grandfather    Hypertension Maternal Grandfather    Healthy Paternal Grandmother    Hyperlipidemia Paternal Grandfather    Diabetes Paternal Grandfather    Hypertension  Paternal Grandfather    Cataracts Paternal Grandfather    Breast cancer Neg Hx    Ovarian cancer Neg Hx    Colon cancer Neg Hx     Review of Systems: Review of Systems  Constitutional: Negative.   Respiratory: Negative.    Cardiovascular: Negative.   Gastrointestinal: Negative.   Neurological: Negative.     Physical Exam: Vital Signs BP 106/66 (BP Location: Left Arm, Patient Position: Sitting, Cuff Size: Normal)   Pulse 78   Ht 5\' 3"  (1.6 m)   Wt 156 lb 3.2 oz (70.9 kg)   LMP 12/07/2023 (Exact Date)   SpO2 98%   BMI 27.67 kg/m  Physical Exam Constitutional:      General: Not in  acute distress.    Appearance: Normal appearance. Not ill-appearing.  HENT:     Head: Normocephalic and atraumatic.  Eyes:     Pupils: Pupils are equal, round Neck:     Musculoskeletal: Normal range of motion.  Cardiovascular:     Rate and Rhythm: Normal rate    Pulses: Normal pulses.  Pulmonary:     Effort: Pulmonary effort is normal. No respiratory distress.  Abdominal:     General: Abdomen is flat. There is no distension.  Musculoskeletal: Normal range of motion.  Skin:    General: Skin is warm and dry.     Findings: No erythema or rash.  Neurological:     General: No focal deficit present.     Mental Status: Alert and oriented to person, place, and time. Mental status is at baseline.     Motor: No weakness.  Psychiatric:        Mood and Affect: Mood normal.        Behavior: Behavior normal.    Assessment/Plan: The patient is scheduled for abdominoplasty with possible liposuction with Dr.  Ladona Ridgel .  Risks, benefits, and alternatives of procedure discussed, questions answered and consent obtained.    Smoking Status: Non-smoker rare; Counseling Given?  N/A  Caprini Score: 3; Risk Factors include: BMI > 25, and length of planned surgery. Recommendation for mechanical prophylaxis. Encourage early ambulation.   Pictures obtained: @consult   Post-op Rx sent to pharmacy:  No  prescription sent at this time as patient is deciding if she is able to move forward with surgery due to childcare  Patient was provided with the General Surgical Risk consent document and Pain Medication Agreement prior to their appointment.  They had adequate time to read through the risk consent documents and Pain Medication Agreement. We also discussed them in person together during this preop appointment. All of their questions were answered to their satisfaction.  Recommended calling if they have any further questions.  Risk consent form and Pain Medication Agreement to be scanned into patient's chart.  The risk that can be encountered for this procedure were discussed and include the following but not limited to these: asymmetry, fluid accumulation, firmness of the tissue, skin loss, decrease or no sensation, fat necrosis, bleeding, infection, healing delay.  Deep vein thrombosis, cardiac and pulmonary complications are risks to any procedure.  There are risks of anesthesia, changes to skin sensation and injury to nerves or blood vessels.  The muscle can be temporarily or permanently injured.  You may have an allergic reaction to tape, suture, glue, blood products which can result in skin discoloration, swelling, pain, skin lesions, poor healing.  Any of these can lead to the need for revisonal surgery or stage procedures.  Weight gain and weigh loss can also effect the long term appearance. The results are not guaranteed to last a lifetime.  Future surgery may be required.    The risks that can be encountered with and after liposuction were discussed and include the following but no limited to these:  Asymmetry, fluid accumulation, firmness of the area, fat necrosis with death of fat tissue, bleeding, infection, delayed healing, anesthesia risks, skin sensation changes, injury to structures including nerves, blood vessels, and muscles which may be temporary or permanent, allergies to tape, suture  materials and glues, blood products, topical preparations or injected agents, skin and contour irregularities, skin discoloration and swelling, deep vein thrombosis, cardiac and pulmonary complications, pain, which may persist, persistent pain, recurrence of the lesion,  poor healing of the incision, possible need for revisional surgery or staged procedures. Thiere can also be persistent swelling, poor wound healing, rippling or loose skin, worsening of cellulite, swelling, and thermal burn or heat injury from ultrasound with the ultrasound-assisted lipoplasty technique. Any change in weight fluctuations can alter the outcome.  Patient is aware to hold all supplements prior to surgery.  I did discuss with patient that if she needs to reschedule surgery, authorization will need to be obtained again.    Electronically signed by: Kermit Balo Beldon Nowling, PA-C 12/13/2023 3:46 PM

## 2023-12-18 ENCOUNTER — Encounter: Payer: Self-pay | Admitting: Surgical

## 2023-12-18 ENCOUNTER — Ambulatory Visit: Admitting: Surgical

## 2023-12-18 DIAGNOSIS — K429 Umbilical hernia without obstruction or gangrene: Secondary | ICD-10-CM

## 2023-12-18 DIAGNOSIS — M6208 Separation of muscle (nontraumatic), other site: Secondary | ICD-10-CM

## 2023-12-18 NOTE — Progress Notes (Signed)
 Patient is a very pleasant 28 year old female who presents via telephone to discuss upcoming abdominoplasty surgery.  At her job she reported that she may not be able to obtain childcare and therefore may need to reschedule surgery.  Today she reports that she was able to arrange childcare after her surgery and would like to move forward and proceed.  She does have some additional questions about recovery and expected outcomes.  We discussed all of her questions to her content.   The patient gave consent to have this visit done by telemedicine / virtual visit, two identifiers were used to identify patient. This is also consent for access the chart and treat the patient via this visit. The patient is located in [place].  I, the provider, am at the office.  We spent 12 minutes together for the visit.  Joined by telephone.  She requested a letter to be sent to her for her to provide to her employer.  She was okay with the letter stating that she was having surgery and we discussed recommended out of work dates.  Addendum: During this phone call, it was discussed with patient that she will not be having liposuction performed during the procedure. We discussed this because she had questions about the incisions on her upper abdomen. I clarified with pt she will not have any upper abdomen incisions.

## 2023-12-31 DIAGNOSIS — M6208 Separation of muscle (nontraumatic), other site: Secondary | ICD-10-CM | POA: Diagnosis not present

## 2023-12-31 DIAGNOSIS — L987 Excessive and redundant skin and subcutaneous tissue: Secondary | ICD-10-CM | POA: Diagnosis not present

## 2024-01-01 ENCOUNTER — Other Ambulatory Visit: Payer: Self-pay | Admitting: Student

## 2024-01-01 ENCOUNTER — Telehealth: Payer: Self-pay | Admitting: Plastic Surgery

## 2024-01-01 HISTORY — PX: OTHER SURGICAL HISTORY: SHX169

## 2024-01-01 MED ORDER — OXYCODONE HCL 5 MG PO TABS: 5.0000 mg | ORAL_TABLET | Freq: Four times a day (QID) | ORAL | 0 refills | Status: DC | PRN

## 2024-01-01 MED ORDER — ONDANSETRON HCL 4 MG PO TABS: 4.0000 mg | ORAL_TABLET | Freq: Three times a day (TID) | ORAL | 0 refills | Status: DC | PRN

## 2024-01-01 NOTE — Telephone Encounter (Signed)
 Patient says that Dr. Ladona Ridgel said he was going to send some meds to her pharmacy for her, please reach out and advise

## 2024-01-01 NOTE — Telephone Encounter (Signed)
 Called and spoke with the patient. Medication were sent in to her requested pharmacy

## 2024-01-01 NOTE — Progress Notes (Signed)
 Patient reports she did not receive postop pain medications. Will go ahead and send those in. She states she has taken oxycodone in past and has not had any issues with oxycodone.

## 2024-01-04 ENCOUNTER — Telehealth: Payer: Self-pay | Admitting: Plastic Surgery

## 2024-01-04 ENCOUNTER — Ambulatory Visit: Admitting: Surgical

## 2024-01-04 DIAGNOSIS — K429 Umbilical hernia without obstruction or gangrene: Secondary | ICD-10-CM

## 2024-01-04 DIAGNOSIS — M6208 Separation of muscle (nontraumatic), other site: Secondary | ICD-10-CM

## 2024-01-04 NOTE — Telephone Encounter (Signed)
 Patient was wondering if you could call her , husband is her ride and currently at work, she is having issues with her incision site.

## 2024-01-04 NOTE — Progress Notes (Signed)
 28 year old female here for follow-up after abdominoplasty on 12/31/2023 with Dr. Ladona Ridgel.  She is 4 days postop.  Patient presents today with concerns over drainage from midline abdominal incision.  She otherwise is feeling well.  Pain is well-controlled.  She is not having any factious symptoms.  She is here with her spouse.  Chaperone present on exam Patient is well-developed, well-nourished, no acute distress.  Breathing is unlabored. On exam abdominal incision is intact and appears to be healing well and as expected at this point.  Umbilicus is viable.  There is some mild drainage from the incision.  Bilateral JP drains are in place with serosanguineous drainage in bulbs.  No significant ecchymosis or swelling noted.  No subcutaneous fluid collection noted with palpation.  A/P:  Patient is doing well after abdominoplasty 4 days ago, there is no signs of infection or concern on exam.  She does have some mild drainage from the incision which is as expected at this point.  It is very minimal.  Discussed with patient continue with ambulation every hour while at home.  Discussed with patient I do not see any signs of concern on exam.  All of her questions were answered to her content.  Pictures taken place in the patient's chart with patient's permission.  Recommend calling with questions or concerns.  She is already scheduled for follow-up next week.

## 2024-01-07 ENCOUNTER — Ambulatory Visit: Payer: Medicaid Other | Admitting: Plastic Surgery

## 2024-01-07 ENCOUNTER — Encounter: Payer: Self-pay | Admitting: Plastic Surgery

## 2024-01-07 VITALS — BP 108/69 | HR 83 | Ht 63.0 in | Wt 150.4 lb

## 2024-01-07 DIAGNOSIS — Z9889 Other specified postprocedural states: Secondary | ICD-10-CM

## 2024-01-07 NOTE — Progress Notes (Signed)
 Ms Tammy Contreras returns today 1 week postop from an abdominoplasty with umbilical hernia repair.  She is doing well with no specific complaints.  Her pain is well-controlled and she is no longer taking opioid medication.  Drain output has been less than 30 mL/day for several days on the right but slightly higher on the left.  She reports she has had a bowel movement but has not been regular yet.  On physical examination all incisions are clean dry and intact.  The output from her drains is thin and serous.  The umbilicus is somewhat congested and slightly darker than the surrounding skin but appears to be viable.  The right drain is removed today without difficulty.  We discussed changing dressings and showering.  She may sleep in any position that she likes.  Activity should still be limited to no heavy lifting and no vigorous activity.  She is instructed to call the office when the left drain remains 30 mL or less for 24 hours.  She can have the drain removed at that time.  Call for any other questions or concerns.

## 2024-01-14 ENCOUNTER — Ambulatory Visit (INDEPENDENT_AMBULATORY_CARE_PROVIDER_SITE_OTHER): Admitting: Surgical

## 2024-01-14 DIAGNOSIS — Z9889 Other specified postprocedural states: Secondary | ICD-10-CM

## 2024-01-14 DIAGNOSIS — K429 Umbilical hernia without obstruction or gangrene: Secondary | ICD-10-CM

## 2024-01-14 NOTE — Progress Notes (Signed)
 28 year old female here for follow-up on her abdominoplasty and umbilical hernia repair.  Surgery was 12/31/2023.  She is 2 weeks postop. She reports she is overall doing well, reports left drain is uncomfortable.  Left drain output has been less than 30 cc/day for several days.  She does not have any specific concerns otherwise.  She does have some questions about swelling and some fullness on the left side that she does not note on the right side.  On exam incisions are intact, clean and dry.  Left JP drain is thin serous fluid.  The umbilicus is viable.  Umbilical incisions intact. No subcutaneous fluid collection noted.  No erythema or cellulitic changes noted of her abdomen.  Abdomen is mildly tender  A/P:  Left drain is removed, patient tolerated this well.  Discussed the importance of compression.  Avoid strenuous activities or heavy lifting.  Discussed sleeping patterns.  Recommend following up in 3 weeks for reevaluation.  Discussed with patient she can certainly be seen sooner if she has any changes or questions or concerns.

## 2024-01-23 ENCOUNTER — Encounter: Payer: Medicaid Other | Admitting: Surgical

## 2024-02-04 ENCOUNTER — Ambulatory Visit (INDEPENDENT_AMBULATORY_CARE_PROVIDER_SITE_OTHER): Payer: Medicaid Other | Admitting: Surgical

## 2024-02-04 VITALS — BP 117/76 | HR 77

## 2024-02-04 DIAGNOSIS — M6208 Separation of muscle (nontraumatic), other site: Secondary | ICD-10-CM

## 2024-02-04 DIAGNOSIS — Z9889 Other specified postprocedural states: Secondary | ICD-10-CM

## 2024-02-04 DIAGNOSIS — K429 Umbilical hernia without obstruction or gangrene: Secondary | ICD-10-CM

## 2024-02-04 NOTE — Progress Notes (Signed)
 28 year old female status post abdominoplasty on 12/31/2023.  She is 5 weeks postop.  She reports is overall doing well.  She has been wearing compressive garments without issue.  She is not having any infectious symptoms.  She does have some questions about her umbilicus and lateral abdominal incision fullness.  She also reports that she has some muscle spasms of her rectus muscles occasionally, mostly in the mornings.  Chaperone present on exam On exam umbilicus is viable, umbilical incision is intact.  Abdominal incision is intact and healing well.  She does have some lateral fullness of bilateral abdominal incisions. Umbilicus does have some swelling.  Abdomen is soft, nontender.  No erythema or cellulitic changes noted.   Patient is doing really well, no signs infection or concern on exam.  Recommend Vaseline to abdominal incision to help soften Dermabond.  She can begin using scar cream once the Dermabond has come off.  Recommend massage to the scar for scar management and improvement.  Discussed that the umbilicus swelling should continue to improve over the next few weeks to months.  Discussed importance of continuing with compression when active, recommend wearing 24/7 for 2 more weeks.  She can increase activity as tolerated after 1 more week.  She can lift up to about 20 pounds at this point.  Pictures were obtained of the patient and placed in the chart with the patient's or guardian's permission.

## 2024-03-03 ENCOUNTER — Ambulatory Visit (INDEPENDENT_AMBULATORY_CARE_PROVIDER_SITE_OTHER): Admitting: Plastic Surgery

## 2024-03-03 ENCOUNTER — Encounter: Payer: Self-pay | Admitting: Plastic Surgery

## 2024-03-03 VITALS — BP 107/70 | HR 80 | Ht 63.0 in | Wt 150.0 lb

## 2024-03-03 DIAGNOSIS — Z9889 Other specified postprocedural states: Secondary | ICD-10-CM

## 2024-03-06 NOTE — Progress Notes (Signed)
 Tammy Contreras a 28 year old female who is now approximately 8 weeks postop from an abdominoplasty.  She is doing well and is returned to essentially presurgical activities.  She is overall happy with the results.  She does note a small fullness at the lateral portion of the incision on the left.  On examination all incisions are clean dry and well-healed.  She has a very nice aesthetic result especially flattening of the anterior posterior dimension.  There is a fullness is noted on the left side at the lateral portion of her incision.  Status post abdominoplasty: Doing well she may return to unrestricted activity.  I have told her to return to see me in the early fall and we will discuss revision of that lateral portion of the scar.

## 2024-03-17 ENCOUNTER — Encounter: Admitting: Surgical

## 2024-07-02 ENCOUNTER — Ambulatory Visit: Admitting: Obstetrics & Gynecology

## 2024-07-10 ENCOUNTER — Ambulatory Visit (INDEPENDENT_AMBULATORY_CARE_PROVIDER_SITE_OTHER)

## 2024-07-10 ENCOUNTER — Other Ambulatory Visit (HOSPITAL_COMMUNITY)
Admission: RE | Admit: 2024-07-10 | Discharge: 2024-07-10 | Disposition: A | Source: Ambulatory Visit | Attending: Certified Nurse Midwife | Admitting: Certified Nurse Midwife

## 2024-07-10 VITALS — BP 94/60 | HR 94 | Ht 63.0 in | Wt 140.0 lb

## 2024-07-10 DIAGNOSIS — N898 Other specified noninflammatory disorders of vagina: Secondary | ICD-10-CM | POA: Diagnosis present

## 2024-07-10 DIAGNOSIS — R399 Unspecified symptoms and signs involving the genitourinary system: Secondary | ICD-10-CM

## 2024-07-10 DIAGNOSIS — Z113 Encounter for screening for infections with a predominantly sexual mode of transmission: Secondary | ICD-10-CM

## 2024-07-10 LAB — POCT URINALYSIS DIPSTICK
Bilirubin, UA: NEGATIVE
Glucose, UA: NEGATIVE
Ketones, UA: NEGATIVE
Nitrite, UA: POSITIVE
Protein, UA: POSITIVE — AB
Spec Grav, UA: 1.025 (ref 1.010–1.025)
Urobilinogen, UA: 0.2 U/dL
pH, UA: 5 (ref 5.0–8.0)

## 2024-07-10 MED ORDER — SULFAMETHOXAZOLE-TRIMETHOPRIM 800-160 MG PO TABS: 1.0000 | ORAL_TABLET | Freq: Two times a day (BID) | ORAL | 1 refills | Status: AC

## 2024-07-10 MED ORDER — FLUCONAZOLE 150 MG PO TABS: 150.0000 mg | ORAL_TABLET | Freq: Once | ORAL | 0 refills | Status: AC

## 2024-07-10 NOTE — Telephone Encounter (Signed)
 Patient called back and is aware.

## 2024-07-10 NOTE — Patient Instructions (Signed)
 Vaginal Yeast Infection, Adult  Vaginal yeast infection is a condition that causes vaginal discharge as well as soreness, swelling, and redness (inflammation) of the vagina. This is a common condition. Some women get this infection frequently. What are the causes? This condition is caused by a change in the normal balance of the yeast (Candida) and normal bacteria that live in the vagina. This change causes an overgrowth of yeast, which causes the inflammation. What increases the risk? The condition is more likely to develop in women who: Take antibiotic medicines. Have diabetes. Take birth control pills. Are pregnant. Douche often. Have a weak body defense system (immune system). Have been taking steroid medicines for a long time. Frequently wear tight clothing. What are the signs or symptoms? Symptoms of this condition include: White, thick, creamy vaginal discharge. Swelling, itching, redness, and irritation of the vagina. The lips of the vagina (labia) may be affected as well. Pain or a burning feeling while urinating. Pain during sex. How is this diagnosed? This condition is diagnosed based on: Your medical history. A physical exam. A pelvic exam. Your health care provider will examine a sample of your vaginal discharge under a microscope. Your health care provider may send this sample for testing to confirm the diagnosis. How is this treated? This condition is treated with medicine. Medicines may be over-the-counter or prescription. You may be told to use one or more of the following: Medicine that is taken by mouth (orally). Medicine that is applied as a cream (topically). Medicine that is inserted directly into the vagina (suppository). Follow these instructions at home: Take or apply over-the-counter and prescription medicines only as told by your health care provider. Do not use tampons until your health care provider approves. Do not have sex until your infection has  cleared. Sex can prolong or worsen your symptoms of infection. Ask your health care provider when it is safe to resume sexual activity. Keep all follow-up visits. This is important. How is this prevented?  Do not wear tight clothes, such as pantyhose or tight pants. Wear breathable cotton underwear. Do not use douches, perfumed soap, creams, or powders. Wipe from front to back after using the toilet. If you have diabetes, keep your blood sugar levels under control. Ask your health care provider for other ways to prevent yeast infections. Contact a health care provider if: You have a fever. Your symptoms go away and then return. Your symptoms do not get better with treatment. Your symptoms get worse. You have new symptoms. You develop blisters in or around your vagina. You have blood coming from your vagina and it is not your menstrual period. You develop pain in your abdomen. Summary Vaginal yeast infection is a condition that causes discharge as well as soreness, swelling, and redness (inflammation) of the vagina. This condition is treated with medicine. Medicines may be over-the-counter or prescription. Take or apply over-the-counter and prescription medicines only as told by your health care provider. Do not douche. Resume sexual activity or use of tampons as instructed by your health care provider. Contact a health care provider if your symptoms do not get better with treatment or your symptoms go away and then return. This information is not intended to replace advice given to you by your health care provider. Make sure you discuss any questions you have with your health care provider. Document Revised: 12/06/2020 Document Reviewed: 12/06/2020 Elsevier Patient Education  2024 Else[employer] Inc.Urinary Tract Infection, Female A urinary tract infection (UTI) is an infection in  your urinary tract. The urinary tract is made up of organs that make, store, and get rid of pee (urine) in your  body. These organs include: The kidneys. The ureters. The bladder. The urethra. What are the causes? Most UTIs are caused by germs called bacteria. They may be in or near your genitals. These germs grow and cause swelling in your urinary tract. What increases the risk? You're more likely to get a UTI if: You're a female. The urethra is shorter in females than in males. You have a soft tube called a catheter that drains your pee. You can't control when you pee or poop. You have trouble peeing because of: A kidney stone. A urinary blockage. A nerve condition that affects your bladder. Not getting enough to drink. You're sexually active. You use a birth control inside your vagina, like spermicide. You're pregnant. You have low levels of the hormone estrogen in your body. You're an older adult. You're also more likely to get a UTI if you have other health problems. These may include: Diabetes. A weak immune system. Your immune system is your body's defense system. Sickle cell disease. Injury of the spine. What are the signs or symptoms? Symptoms may include: Needing to pee right away. Peeing small amounts often. Pain or burning when you pee. Blood in your pee. Pee that smells bad or odd. Pain in your belly or lower back. You may also: Feel confused. This may be the first symptom in older adults. Vomit. Not feel hungry. Feel tired or easily annoyed. Have a fever or chills. How is this diagnosed? A UTI is diagnosed based on your medical history and an exam. You may also have other tests. These may include: Pee tests. Blood tests. Tests for sexually transmitted infections (STIs). If you've had more than one UTI, you may need to have imaging studies done to find out why you keep getting them. How is this treated? A UTI can be treated by: Taking antibiotics or other medicines. Drinking enough fluid to keep your pee pale yellow. In rare cases, a UTI can cause a very bad  condition called sepsis. Sepsis may be treated in the hospital. Follow these instructions at home: Medicines Take your medicines only as told by your health care provider. If you were given antibiotics, take them as told by your provider. Do not stop taking them even if you start to feel better. General instructions Make sure you: Pee often and fully. Do not hold your pee for a long time. Wipe from front to back after you pee or poop. Use each tissue only once when you wipe. Pee after you have sex. Do not douche or use sprays or powders in your genital area. Contact a health care provider if: Your symptoms don't get better after 1-2 days of taking antibiotics. Your symptoms go away and then come back. You have a fever or chills. You vomit or feel like you may vomit. Get help right away if: You have very bad pain in your back or lower belly. You faint. This information is not intended to replace advice given to you by your health care provider. Make sure you discuss any questions you have with your health care provider. Document Revised: 08/29/2023 Document Reviewed: 12/22/2022 Elsevier Patient Education  2025 [employer].

## 2024-07-10 NOTE — Progress Notes (Signed)
    NURSE VISIT NOTE  Subjective:    Patient ID: Tammy Contreras, female    DOB: [birth year], 28 y.o.   MRN: [id number]  HPI  Patient is a 28 y.o. G58P3003 female who presents for vaginal discharge, irritation, urinary frequency and burning and mild pelvic pain for the last week. Denies vaginal odor and/or back pain. Would like STD testing added to swab. Denies abnormal vaginal bleeding or significant pelvic pain or fever. Patient does not have a history of known exposure to STD.   Objective:    BP 94/60   Pulse 94   Ht 5' 3 (1.6 m)   Wt 140 lb (63.5 kg)   LMP 06/11/2024 (Exact Date)   Breastfeeding No   BMI 24.80 kg/m    Results for orders placed or performed in visit on 07/10/24  POCT Urinalysis Dipstick  Result Value Ref Range   Color, UA     Clarity, UA     Glucose, UA Negative Negative   Bilirubin, UA neg    Ketones, UA neg    Spec Grav, UA 1.025 1.010 - 1.025   Blood, UA 3+    pH, UA 5.0 5.0 - 8.0   Protein, UA Positive (A) Negative   Urobilinogen, UA 0.2 0.2 or 1.0 E.U./dL   Nitrite, UA positive    Leukocytes, UA Moderate (2+) (A) Negative   Appearance     Odor      Assessment:   1. Vaginal itching   2. UTI symptoms   3. Screening for STD (sexually transmitted disease)      Plan:   Aptima and urine culture sent to lab. Treatment: Diflucan  sent as it works great for her. Bactrim  DS sent given UA results. ROV prn if symptoms persist or worsen.   Beola Skeens, CMA

## 2024-07-11 LAB — CERVICOVAGINAL ANCILLARY ONLY
Bacterial Vaginitis (gardnerella): NEGATIVE
Candida Glabrata: NEGATIVE
Candida Vaginitis: POSITIVE — AB
Chlamydia: NEGATIVE
Comment: NEGATIVE
Comment: NEGATIVE
Comment: NEGATIVE
Comment: NEGATIVE
Comment: NEGATIVE
Comment: NORMAL
Neisseria Gonorrhea: NEGATIVE
Trichomonas: NEGATIVE

## 2024-07-14 ENCOUNTER — Ambulatory Visit: Payer: Self-pay

## 2024-07-14 ENCOUNTER — Other Ambulatory Visit: Payer: Self-pay

## 2024-07-14 DIAGNOSIS — B379 Candidiasis, unspecified: Secondary | ICD-10-CM

## 2024-07-14 LAB — URINE CULTURE

## 2024-07-14 MED ORDER — FLUCONAZOLE 150 MG PO TABS: 150.0000 mg | ORAL_TABLET | Freq: Every day | ORAL | 0 refills | Status: AC

## 2024-07-23 ENCOUNTER — Ambulatory Visit: Admitting: Obstetrics & Gynecology

## 2024-07-23 ENCOUNTER — Encounter: Payer: Self-pay | Admitting: Obstetrics & Gynecology

## 2024-07-23 VITALS — BP 101/67 | HR 67 | Ht 63.0 in | Wt 142.0 lb

## 2024-07-23 DIAGNOSIS — Z23 Encounter for immunization: Secondary | ICD-10-CM

## 2024-07-23 DIAGNOSIS — Z01419 Encounter for gynecological examination (general) (routine) without abnormal findings: Secondary | ICD-10-CM

## 2024-07-23 NOTE — Progress Notes (Unsigned)
 GYNECOLOGY ANNUAL PHYSICAL EXAM PROGRESS NOTE  Subjective:    Tammy Contreras is a married 28 y.o. G3P3003 (80, 48, 78 month old kids) who presents for an annual exam.  The patient is sexually active. She is using withdrawal for contraception. She has used an IUD in the past but as of today wants to continue with withdrawal. The patient participates in regular exercise: yes. (Chasing 3 kids) Has the patient ever been transfused or tattooed?: no. The patient reports that there is not domestic violence in her life.   The patient has the following complaints today: She gets recurrent yeast infections, most recently 07/10/2024. She was treated and is doing well now.  Menstrual History: Menarche age: 54 Patient's last menstrual period was 07/18/2024 (exact date).     Gynecologic History:  Contraception: coitus interruptus History of STI's:  Last Pap: 2023. Results were: normal.  Denies h/o abnormal pap smears.     OB History  Gravida Para Term Preterm AB Living  3 3 3  0 0 3  SAB IAB Ectopic Multiple Live Births  0 0 0 0 3    # Outcome Date GA Lbr Len/2nd Weight Sex Type Anes PTL Lv  3 Term 03/04/23 [redacted]w[redacted]d  6 lb 4.2 oz (2.84 kg) M CS-LVertical Spinal  LIV     Name: DEIONNA, MARCANTONIO [facility]     Apgar1: 9  Apgar5: 10  2 Term 02/22/19 [redacted]w[redacted]d / 00:05 7 lb 6.2 oz (3.35 kg) F Vag-Spont None N LIV     Name: [name]     Apgar1: 8  Apgar5: 9  1 Term 08/19/13   7 lb 14 oz (3.572 kg) F Vag-Spont None N LIV     Name: Berwyn    Past Medical History:  Diagnosis Date   Anemia    Asthma     Past Surgical History:  Procedure Laterality Date   CESAREAN SECTION N/A 03/04/2023   Procedure: CESAREAN SECTION;  Surgeon: Janit Alm Agent, MD;  Location: [facility] ORS;  Service: Obstetrics;  Laterality: N/A;   OTHER SURGICAL HISTORY  01/01/2024   Tummy Tuck   WISDOM TOOTH EXTRACTION      Family History  Problem Relation Age of Onset   Hypertension Mother        borderline    Diabetes Mother        Borderline   Hyperlipidemia Mother        borderline   Healthy Father    Healthy Sister    Asthma Brother    Diabetes Maternal Grandfather    Hypertension Maternal Grandfather    Healthy Paternal Grandmother    Hyperlipidemia Paternal Grandfather    Diabetes Paternal Grandfather    Hypertension Paternal Grandfather    Cataracts Paternal Grandfather    Breast cancer Neg Hx    Ovarian cancer Neg Hx    Colon cancer Neg Hx     Social History   Socioeconomic History   Marital status: Married    Spouse name: Melonie   Number of children: 2   Years of education: 14   Highest education level: Not on file  Occupational History   Occupation: Sales executive  Tobacco Use   Smoking status: Never   Smokeless tobacco: Never  Vaping Use   Vaping status: Never Used  Substance and Sexual Activity   Alcohol use: Not Currently   Drug use: Never   Sexual activity: Yes    Partners: Male    Birth control/protection: None  Other Topics  Concern   Not on file  Social History Narrative   ** Merged History Encounter **       Social Drivers of Health   Financial Resource Strain: Low Risk  (07/31/2022)   Overall Financial Resource Strain (CARDIA)    Difficulty of Paying Living Expenses: Not very hard  Food Insecurity: No Food Insecurity (03/04/2023)   Hunger Vital Sign    Worried About Running Out of Food in the Last Year: Never true    Ran Out of Food in the Last Year: Never true  Transportation Needs: No Transportation Needs (03/04/2023)   PRAPARE - Administrator, Civil Service (Medical): No    Lack of Transportation (Non-Medical): No  Physical Activity: Inactive (07/31/2022)   Exercise Vital Sign    Days of Exercise per Week: 0 days    Minutes of Exercise per Session: 0 min  Stress: No Stress Concern Present (07/31/2022)   [employer] of Occupational Health - Occupational Stress Questionnaire    Feeling of Stress : Not at all  Social  Connections: Moderately Integrated (07/31/2022)   Social Connection and Isolation Panel    Frequency of Communication with Friends and Family: More than three times a week    Frequency of Social Gatherings with Friends and Family: Twice a week    Attends Religious Services: More than 4 times per year    Active Member of [employer] or Organizations: No    Attends Banker Meetings: Never    Marital Status: Married  Catering manager Violence: Not At Risk (07/31/2022)   Humiliation, Afraid, Rape, and Kick questionnaire    Fear of Current or Ex-Partner: No    Emotionally Abused: No    Physically Abused: No    Sexually Abused: No    Current Outpatient Medications on File Prior to Visit  Medication Sig Dispense Refill   fluconazole  (DIFLUCAN ) 150 MG tablet Take 1 tablet (150 mg total) by mouth daily. 1 tablet 0   sulfamethoxazole -trimethoprim  (BACTRIM  DS) 800-160 MG tablet Take 1 tablet by mouth 2 (two) times daily. 14 tablet 1   No current facility-administered medications on file prior to visit.    No Known Allergies   Review of Systems Constitutional: negative for chills, fatigue, fevers and sweats She works at [employer] since 2017. Eyes: negative for irritation, redness and visual disturbance Ears, nose, mouth, throat, and face: negative for hearing loss, nasal congestion, snoring and tinnitus Respiratory: negative for asthma, cough, sputum Cardiovascular: negative for chest pain, dyspnea, exertional chest pressure/discomfort, irregular heart beat, palpitations and syncope Gastrointestinal: negative for abdominal pain, change in bowel habits, nausea and vomiting Genitourinary: negative for abnormal menstrual periods, genital lesions, sexual problems and vaginal discharge, dysuria and urinary incontinence Integument/breast: negative for breast lump, breast tenderness and nipple discharge Hematologic/lymphatic: negative for bleeding and easy  bruising Musculoskeletal:negative for back pain and muscle weakness Neurological: negative for dizziness, headaches, vertigo and weakness Endocrine: negative for diabetic symptoms including polydipsia, polyuria and skin dryness Allergic/Immunologic: negative for hay fever and urticaria      Objective:  Height 5' 3 (1.6 m), weight 142 lb (64.4 kg), last menstrual period 07/18/2024, not currently breastfeeding. Body mass index is 25.15 kg/m.    General Appearance:    Alert, cooperative, no distress, appears stated age  Head:    Normocephalic, without obvious abnormality, atraumatic  Eyes:    PERRL, conjunctiva/corneas clear, EOM's intact, both eyes  Ears:    Normal external ear canals, both  ears  Nose:   Nares normal, septum midline, mucosa normal, no drainage or sinus tenderness  Throat:   Lips, mucosa, and tongue normal; teeth and gums normal  Neck:   Supple, symmetrical, trachea midline, no adenopathy; thyroid: no enlargement/tenderness/nodules; no carotid bruit or JVD  Back:     Symmetric, no curvature, ROM normal, no CVA tenderness  Lungs:     Clear to auscultation bilaterally, respirations unlabored  Chest Wall:    No tenderness or deformity   Heart:    Regular rate and rhythm, S1 and S2 normal, no murmur, rub or gallop  Breast Exam:    No tenderness, masses, or nipple abnormality  Abdomen:     Soft, non-tender, bowel sounds active all four quadrants, no masses, no organomegaly.    Genitalia:    Pelvic:external genitalia normal, vagina without lesions, discharge, or tenderness, Cervix normal in appearance, no cervical motion tenderness, no adnexal masses or tenderness.  Uterus normal size, anteverted, normal shape, mobile, regular contours, nontender.     Extremities:   Extremities normal, atraumatic, no cyanosis or edema  Pulses:   2+ and symmetric all extremities  Skin:   Skin color, texture, turgor normal, no rashes or lesions  Lymph nodes:   Cervical, supraclavicular, and  axillary nodes normal  Neurologic:   CNII-XII intact, normal strength, sensation and reflexes throughout   .  Labs:  Lab Results  Component Value Date   WBC 6.3 03/12/2023   HGB 8.9 (L) 03/12/2023   HCT 27.0 (L) 03/12/2023   MCV 98 (H) 03/12/2023   PLT 287 03/12/2023    Lab Results  Component Value Date   CREATININE 0.57 03/09/2023   BUN 16 03/09/2023   NA 137 03/09/2023   K 4.4 03/09/2023   CL 102 03/09/2023   CO2 23 03/09/2023    Lab Results  Component Value Date   ALT 69 (H) 05/14/2023   AST 33 05/14/2023   ALKPHOS 102 05/14/2023   BILITOT 0.5 05/14/2023    Assessment:   Well woman exam   Plan:   Breast self exam technique reviewed and patient encouraged to perform self-exam monthly. Contraception: coitus interruptus. Discussed healthy lifestyle modifications. Pap smear due in 2026. Flu vaccine: yes Follow up in 1 year for annual exam   Starla Harland BROCKS, MD [facility]

## 2024-11-20 ENCOUNTER — Ambulatory Visit: Admitting: Plastic Surgery

## 2024-11-24 ENCOUNTER — Ambulatory Visit: Admitting: Plastic Surgery
